# Patient Record
Sex: Male | Born: 1965 | Race: White | Hispanic: No | Smoking: Former smoker
Health system: Southern US, Community
[De-identification: ages and names within clinical notes are randomized; demographics above are authoritative.]

## PROBLEM LIST (undated history)

## (undated) DIAGNOSIS — Z89512 Acquired absence of left leg below knee: Secondary | ICD-10-CM

## (undated) DIAGNOSIS — E119 Type 2 diabetes mellitus without complications: Secondary | ICD-10-CM

## (undated) DIAGNOSIS — B019 Varicella without complication: Secondary | ICD-10-CM

## (undated) DIAGNOSIS — I10 Essential (primary) hypertension: Secondary | ICD-10-CM

## (undated) DIAGNOSIS — E785 Hyperlipidemia, unspecified: Secondary | ICD-10-CM

## (undated) HISTORY — DX: Type 2 diabetes mellitus without complications: E11.9

## (undated) HISTORY — DX: Essential (primary) hypertension: I10

## (undated) HISTORY — PX: APPENDECTOMY: SHX54

## (undated) HISTORY — DX: Hyperlipidemia, unspecified: E78.5

## (undated) HISTORY — DX: Acquired absence of left leg below knee: Z89.512

## (undated) HISTORY — DX: Varicella without complication: B01.9

## (undated) HISTORY — PX: AMPUTATION: SHX166

---

## 2001-11-01 ENCOUNTER — Encounter: Payer: Self-pay | Admitting: *Deleted

## 2001-11-01 ENCOUNTER — Ambulatory Visit (HOSPITAL_COMMUNITY): Admission: RE | Admit: 2001-11-01 | Discharge: 2001-11-01 | Payer: Self-pay | Admitting: *Deleted

## 2003-07-14 ENCOUNTER — Inpatient Hospital Stay (HOSPITAL_COMMUNITY): Admission: EM | Admit: 2003-07-14 | Discharge: 2003-07-17 | Payer: Self-pay

## 2003-08-20 ENCOUNTER — Encounter (HOSPITAL_BASED_OUTPATIENT_CLINIC_OR_DEPARTMENT_OTHER): Admission: RE | Admit: 2003-08-20 | Discharge: 2003-11-18 | Payer: Self-pay | Admitting: Internal Medicine

## 2003-11-19 ENCOUNTER — Encounter (HOSPITAL_BASED_OUTPATIENT_CLINIC_OR_DEPARTMENT_OTHER): Admission: RE | Admit: 2003-11-19 | Discharge: 2004-02-23 | Payer: Self-pay | Admitting: Internal Medicine

## 2004-02-24 ENCOUNTER — Encounter (HOSPITAL_BASED_OUTPATIENT_CLINIC_OR_DEPARTMENT_OTHER): Admission: RE | Admit: 2004-02-24 | Discharge: 2004-05-24 | Payer: Self-pay | Admitting: Internal Medicine

## 2004-03-11 ENCOUNTER — Encounter (INDEPENDENT_AMBULATORY_CARE_PROVIDER_SITE_OTHER): Payer: Self-pay | Admitting: *Deleted

## 2004-03-11 ENCOUNTER — Ambulatory Visit (HOSPITAL_BASED_OUTPATIENT_CLINIC_OR_DEPARTMENT_OTHER): Admission: RE | Admit: 2004-03-11 | Discharge: 2004-03-11 | Payer: Self-pay | Admitting: Orthopedic Surgery

## 2004-03-11 ENCOUNTER — Ambulatory Visit (HOSPITAL_COMMUNITY): Admission: RE | Admit: 2004-03-11 | Discharge: 2004-03-11 | Payer: Self-pay | Admitting: Orthopedic Surgery

## 2004-06-08 ENCOUNTER — Encounter (HOSPITAL_BASED_OUTPATIENT_CLINIC_OR_DEPARTMENT_OTHER): Admission: RE | Admit: 2004-06-08 | Discharge: 2004-06-16 | Payer: Self-pay | Admitting: Internal Medicine

## 2004-10-21 ENCOUNTER — Encounter (INDEPENDENT_AMBULATORY_CARE_PROVIDER_SITE_OTHER): Payer: Self-pay | Admitting: Specialist

## 2004-10-21 ENCOUNTER — Ambulatory Visit (HOSPITAL_COMMUNITY): Admission: RE | Admit: 2004-10-21 | Discharge: 2004-10-21 | Payer: Self-pay | Admitting: Orthopedic Surgery

## 2005-11-14 ENCOUNTER — Emergency Department (HOSPITAL_COMMUNITY): Admission: EM | Admit: 2005-11-14 | Discharge: 2005-11-15 | Payer: Self-pay | Admitting: Emergency Medicine

## 2006-11-06 ENCOUNTER — Inpatient Hospital Stay (HOSPITAL_COMMUNITY): Admission: EM | Admit: 2006-11-06 | Discharge: 2006-11-07 | Payer: Self-pay | Admitting: Emergency Medicine

## 2008-05-22 ENCOUNTER — Ambulatory Visit (HOSPITAL_COMMUNITY): Admission: RE | Admit: 2008-05-22 | Discharge: 2008-05-22 | Payer: Self-pay

## 2008-05-29 ENCOUNTER — Encounter (INDEPENDENT_AMBULATORY_CARE_PROVIDER_SITE_OTHER): Payer: Self-pay | Admitting: Orthopedic Surgery

## 2008-05-29 ENCOUNTER — Inpatient Hospital Stay (HOSPITAL_COMMUNITY): Admission: RE | Admit: 2008-05-29 | Discharge: 2008-06-01 | Payer: Self-pay | Admitting: Orthopedic Surgery

## 2008-09-18 ENCOUNTER — Encounter: Admission: RE | Admit: 2008-09-18 | Discharge: 2008-10-17 | Payer: Self-pay | Admitting: Orthopedic Surgery

## 2009-05-27 ENCOUNTER — Ambulatory Visit (HOSPITAL_COMMUNITY): Admission: RE | Admit: 2009-05-27 | Discharge: 2009-05-27 | Payer: Self-pay | Admitting: Orthopedic Surgery

## 2009-07-29 ENCOUNTER — Encounter (INDEPENDENT_AMBULATORY_CARE_PROVIDER_SITE_OTHER): Payer: Self-pay | Admitting: Orthopedic Surgery

## 2009-07-29 ENCOUNTER — Inpatient Hospital Stay (HOSPITAL_COMMUNITY): Admission: RE | Admit: 2009-07-29 | Discharge: 2009-08-03 | Payer: Self-pay | Admitting: Orthopedic Surgery

## 2010-06-07 LAB — GLUCOSE, CAPILLARY
Glucose-Capillary: 127 mg/dL — ABNORMAL HIGH (ref 70–99)
Glucose-Capillary: 146 mg/dL — ABNORMAL HIGH (ref 70–99)
Glucose-Capillary: 211 mg/dL — ABNORMAL HIGH (ref 70–99)

## 2010-06-08 LAB — COMPREHENSIVE METABOLIC PANEL
AST: 13 U/L (ref 0–37)
BUN: 9 mg/dL (ref 6–23)
CO2: 24 mEq/L (ref 19–32)
Chloride: 99 mEq/L (ref 96–112)
Creatinine, Ser: 0.94 mg/dL (ref 0.4–1.5)
GFR calc Af Amer: >60 mL/min (ref >60)
GFR calc non Af Amer: >60 mL/min (ref >60)
Glucose, Bld: 252 mg/dL — ABNORMAL HIGH (ref 70–99)
Total Bilirubin: 1.1 mg/dL (ref 0.3–1.2)

## 2010-06-08 LAB — CBC
HCT: 35.6 % — ABNORMAL LOW (ref 39.0–52.0)
Hemoglobin: 12.4 g/dL — ABNORMAL LOW (ref 13.0–17.0)
MCV: 86.8 fL (ref 78.0–100.0)
RBC: 4.1 MIL/uL — ABNORMAL LOW (ref 4.22–5.81)
WBC: 12 10*3/uL — ABNORMAL HIGH (ref 4.0–10.5)

## 2010-06-08 LAB — GLUCOSE, CAPILLARY
Glucose-Capillary: 162 mg/dL — ABNORMAL HIGH (ref 70–99)
Glucose-Capillary: 173 mg/dL — ABNORMAL HIGH (ref 70–99)
Glucose-Capillary: 209 mg/dL — ABNORMAL HIGH (ref 70–99)
Glucose-Capillary: 233 mg/dL — ABNORMAL HIGH (ref 70–99)
Glucose-Capillary: 243 mg/dL — ABNORMAL HIGH (ref 70–99)
Glucose-Capillary: 253 mg/dL — ABNORMAL HIGH (ref 70–99)
Glucose-Capillary: 268 mg/dL — ABNORMAL HIGH (ref 70–99)
Glucose-Capillary: 283 mg/dL — ABNORMAL HIGH (ref 70–99)

## 2010-06-08 LAB — CULTURE, ROUTINE-ABSCESS

## 2010-06-08 LAB — APTT: aPTT: 36 seconds (ref 24–37)

## 2010-06-08 LAB — PROTIME-INR
INR: 1.21 (ref 0.00–1.49)
Prothrombin Time: 14.7 seconds (ref 11.6–15.2)
Prothrombin Time: 15.2 seconds (ref 11.6–15.2)
Prothrombin Time: 23.2 seconds — ABNORMAL HIGH (ref 11.6–15.2)

## 2010-06-13 LAB — COMPREHENSIVE METABOLIC PANEL
Albumin: 3.8 g/dL (ref 3.5–5.2)
BUN: 11 mg/dL (ref 6–23)
Creatinine, Ser: 0.76 mg/dL (ref 0.4–1.5)
GFR calc Af Amer: >60 mL/min (ref >60)
Potassium: 4.1 mEq/L (ref 3.5–5.1)
Total Protein: 6.8 g/dL (ref 6.0–8.3)

## 2010-06-13 LAB — CBC
HCT: 40.4 % (ref 39.0–52.0)
MCV: 88.6 fL (ref 78.0–100.0)
Platelets: 207 10*3/uL (ref 150–400)
RDW: 12.8 % (ref 11.5–15.5)

## 2010-06-13 LAB — APTT: aPTT: 30 seconds (ref 24–37)

## 2010-06-13 LAB — GLUCOSE, CAPILLARY
Glucose-Capillary: 180 mg/dL — ABNORMAL HIGH (ref 70–99)
Glucose-Capillary: 196 mg/dL — ABNORMAL HIGH (ref 70–99)

## 2010-06-13 LAB — PROTIME-INR: INR: 0.9 (ref 0.00–1.49)

## 2010-07-01 LAB — GLUCOSE, CAPILLARY
Glucose-Capillary: 111 mg/dL — ABNORMAL HIGH (ref 70–99)
Glucose-Capillary: 236 mg/dL — ABNORMAL HIGH (ref 70–99)
Glucose-Capillary: 236 mg/dL — ABNORMAL HIGH (ref 70–99)
Glucose-Capillary: 254 mg/dL — ABNORMAL HIGH (ref 70–99)
Glucose-Capillary: 270 mg/dL — ABNORMAL HIGH (ref 70–99)
Glucose-Capillary: 275 mg/dL — ABNORMAL HIGH (ref 70–99)

## 2010-07-01 LAB — HEMOGLOBIN A1C: Mean Plasma Glucose: 169 mg/dL

## 2010-07-01 LAB — BASIC METABOLIC PANEL
BUN: 10 mg/dL (ref 6–23)
BUN: 7 mg/dL (ref 6–23)
BUN: 8 mg/dL (ref 6–23)
Calcium: 9.1 mg/dL (ref 8.4–10.5)
Calcium: 9.4 mg/dL (ref 8.4–10.5)
Calcium: 9.5 mg/dL (ref 8.4–10.5)
Chloride: 97 mEq/L (ref 96–112)
Creatinine, Ser: 0.72 mg/dL (ref 0.4–1.5)
Creatinine, Ser: 0.9 mg/dL (ref 0.4–1.5)
GFR calc non Af Amer: >60 mL/min (ref >60)
GFR calc non Af Amer: >60 mL/min (ref >60)
Glucose, Bld: 175 mg/dL — ABNORMAL HIGH (ref 70–99)
Glucose, Bld: 202 mg/dL — ABNORMAL HIGH (ref 70–99)
Potassium: 4.2 mEq/L (ref 3.5–5.1)
Sodium: 135 mEq/L (ref 135–145)
Sodium: 136 mEq/L (ref 135–145)

## 2010-07-01 LAB — CBC
HCT: 39.2 % (ref 39.0–52.0)
Hemoglobin: 13.7 g/dL (ref 13.0–17.0)
MCV: 90 fL (ref 78.0–100.0)
RBC: 4.35 MIL/uL (ref 4.22–5.81)
WBC: 9 10*3/uL (ref 4.0–10.5)

## 2010-07-01 LAB — APTT: aPTT: 32 seconds (ref 24–37)

## 2010-07-01 LAB — PROTIME-INR: Prothrombin Time: 15.1 seconds (ref 11.6–15.2)

## 2010-07-01 LAB — COMPREHENSIVE METABOLIC PANEL
AST: 43 U/L — ABNORMAL HIGH (ref 0–37)
BUN: 7 mg/dL (ref 6–23)
CO2: 25 mEq/L (ref 19–32)
Chloride: 102 mEq/L (ref 96–112)
Creatinine, Ser: 0.82 mg/dL (ref 0.4–1.5)
GFR calc non Af Amer: >60 mL/min (ref >60)
Total Bilirubin: 0.4 mg/dL (ref 0.3–1.2)

## 2010-08-03 NOTE — H&P (Signed)
Luis Lane, Luis Lane NO.:  [id number]   MEDICAL RECORD NO.:  [id number]          PATIENT TYPE:  OBV   LOCATION:  1529                         FACILITY:  [facility]   PHYSICIAN:  Hettie Holstein, D.O.    DATE OF BIRTH:  04-Apr-1965   DATE OF ADMISSION:  11/05/2006  DATE OF DISCHARGE:                              HISTORY & PHYSICAL   PRIMARY CARE PHYSICIAN:  Unassigned.   ORTHOPEDIST:  Nadara Mustard, M.D.   CHIEF COMPLAINT:  Redness and pain around chronic ulcer.   HISTORY OF PRESENT ILLNESS:  Luis Lane is a 45 year old male with a  history of diabetes, non-insulin-requiring who has a prior history of  amputations due to osteomyelitis.  First in 2005, he had a 1st ray  amputation in December 2005.  As well as a left 1st ray amputation by  Dr. Lajoyce Corners in August 2006.  He had recently been managing these chronic  ulcers on his left foot at home with Silvadene and a Gelfoam.  He has  been transitioning his care to [facility] physicians as he is moving  there.  He had an MRI about 4 months ago, however, he never followed up  to obtain these results.  He does have special orthotics for both of his  shoes.  He had been at [employer] this past few days and returned  home, had some chills and subjective fevers, one of the mornings of the  past few days.  He presents to the emergency department.  He was  evaluated by Dr. Preston Fleeting  and he is noted to have erythema surrounding his  ulcer.   PAST MEDICAL HISTORY:  1. Diabetes with neuropathy.  2. Hypercholesterolemia.  3. 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  4. History of tobacco abuse, continued.  5. Left arm fracture in the past.   MEDICATIONS:  1. Pravastatin 20 mg daily.  2. Benazepril 5 mg daily.  3. Glyburide.  4. Metformin., he is uncertain of the dose.   ALLERGIES:  OPIATES.  HE DEVELOPS SWELLING.   SOCIAL HISTORY:  He is a Luis Lane.  He smokes  about a pack of  cigarettes per day.  He has failed cessation with Chantix.  He is moving  to [place] and trying to establish care there.   FAMILY HISTORY:  Both of his parents are living in the mid 19s.  His  mother had uterine cancer and both suffered with diabetes.   REVIEW OF SYSTEMS:  His weight has been stable.  Appetite is stable.  No  nausea, vomiting.  He has had some subjective fevers and chills.  No  swelling of his lower extremities.  No dysuria or GI complaints.  Full  review of systems is negative.   PHYSICAL EXAMINATION:  VITAL SIGNS:  Temperature is 97.6, heart rate 91,  blood pressure 115/74, respirations 18, O2 saturation 96% on room air.  HEENT:  Reveals head to be normocephalic atraumatic.  Extraocular  muscles are intact.  NECK:  Supple, nontender.  No palpable thyromegaly or mass.  CARDIOVASCULAR:  Reveals normal S1 S2.  LUNGS:  Clear to auscultation.  ABDOMEN:  Soft, nontender.  No rebound or guarding.  LOWER EXTREMITIES:  Examination of his foot on the right was intact.  There was some small area of early pressure sore on his medial aspect of  his right foot.  On his left, he does have some excoriations on the  dorsal aspect of his toes which he states is secondary to his daughter  stepping on his foot on his plantar aspect of his foot.  On the plantar  aspect of his foot there is about a 2-to-3-cm wound with serosanguineous  central drainage.  There is surrounding erythema with yellowish-tan  circumferential eschar.  He does have another ulcer that appears to be  clean and dry on the more distal aspect of his post 1st ray amputation  site.   ASSESSMENT:  1. Diabetic foot ulcer with a possible osteomyelitis.  He does have      appearance of cellulitis.  2. Diabetes.  3. Tobacco abuse.  4. Hypertension.   PLAN:  1. At this time we will admit Luis Lane to the hospital.  2. We will obtain an MRI to ensure he is not developing osteomyelitis.  3. He may  need to see Dr. Lajoyce Corners depending on what this study shows.  4. We will place him on IV antibiotics and follow his clinical course.      Hettie Holstein, D.O.  Electronically Signed     ESS/MEDQ  D:  11/05/2006  T:  11/05/2006  Job:  [id number]   cc:   Nadara Mustard, MD  Fax: [phone]

## 2010-08-03 NOTE — Op Note (Signed)
[name], [name]                 ACCOUNT NO.:  [id number]   MEDICAL RECORD NO.:  [id number]          PATIENT TYPE:  INP   LOCATION:  2550                         FACILITY:  [facility]   PHYSICIAN:  Nadara Mustard, MD     DATE OF BIRTH:  Apr 21, 1965   DATE OF PROCEDURE:  05/29/2008  DATE OF DISCHARGE:                               OPERATIVE REPORT   PREOPERATIVE DIAGNOSES:  Osteomyelitis and abscess, left foot.   POSTOPERATIVE DIAGNOSES:  Osteomyelitis and abscess, left foot.   PROCEDURE:  Left transtibial amputation.   SURGEON:  Nadara Mustard, MD   ANESTHESIA:  General.   ESTIMATED BLOOD LOSS:  Minimal.   ANTIBIOTICS:  1 g of Kefzol.   DRAINS:  None.   COMPLICATIONS:  None.   TOURNIQUET TIME:  7 minutes at 300 mmHg to the thigh.  Leg sent to  pathology.   DISPOSITION:  PACU in stable condition.   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  The patient has had foot salvage surgery in the past, however, presents  at this time with osteomyelitis and a purulent abscess of the hindfoot.  Due to lack of viable foot salvage options, the patient presents at this  time for a transtibial amputation.  Risks and benefits were discussed  including persistent infection, neurovascular injury, nonhealing of the  wound, need for additional surgery.  The patient states he understands  and wishes to proceed at this time.   DESCRIPTION OF PROCEDURE:  The patient was brought to OR room 1 and  underwent general anesthetic.  After adequate level of anesthesia  obtained, the patient's left lower extremity was prepped using DuraPrep,  draped in a sterile field, and the foot was draped into an impervious  stockinette and was draped out of the surgical field.  A transverse  incision was made 10 cm distal to the tibial tubercle.  This curved  proximally and a large posterior flap was created.  The tibia  was  beveled anteriorly and transected 1 cm proximal to the skin incision.  The fibula was transected 1 cm proximal to the tibial incision.  An  amputation knife was used to create a large posterior flap.  The sciatic  nerve was pulled, cut, and allowed to retract.  The vascular bundles  were suture ligated 3 each with 2-0 silk.  The tourniquet was deflated after 7 minutes.  Hemostasis was obtained.  The deep and superficial fascial layers were closed using #1 PDS.  Skin  was closed using approximate staples.  The wound was covered with  Adaptic orthopedic sponges, ABD dressing, Kerlix, and Coban.  The  patient was then extubated, taken to PACU in stable condition.      Nadara Mustard, MD  Electronically Signed     MVD/MEDQ  D:  05/29/2008  T:  05/29/2008  Job:  [id number]

## 2010-08-06 NOTE — Op Note (Signed)
Luis Lane, Luis Lane NO.:  [id number]   MEDICAL RECORD NO.:  [id number]           PATIENT TYPE:   LOCATION:  2550                         FACILITY:  [facility]   PHYSICIAN:  Nadara Mustard, MD     DATE OF BIRTH:  06/06/1965   DATE OF PROCEDURE:  10/21/2004  DATE OF DISCHARGE:                                 OPERATIVE REPORT   PREOP DIAGNOSIS:  Osteomyelitis, left first metatarsal with Wagner grade 3  ulceration.   POSTOP DIAGNOSIS:  Osteomyelitis, left first metatarsal with Wagner grade 3  ulceration.   PROCEDURE:  Left first ray amputation.   SURGEON:  Nadara Mustard, MD   ANESTHESIA:  Ankle block.   ESTIMATED BLOOD LOSS:  Minimal.   ANTIBIOTICS:  Clindamycin 600 milligrams IV.   TOURNIQUET TIME:  None.   DRAINS:  None.   COMPLICATIONS:  None.   DISPOSITION:  To PACU in stable condition.   PLAN:  For discharge to home. Prescription for Darvocet for pain. The  patient has Cleocin at home that he will take 300 milligrams p.o. t.i.d. He  was given instructions for ice, elevation, nonweightbearing on the left.   INDICATIONS FOR PROCEDURE:  Patient is a 45 year old gentleman who is status  post sesamoid excision on the left for osteomyelitis of the sesamoids. The  patient has had a progressive ulceration over the first metatarsal head on  the left. After failure of conservative care with pressure unloading, wound  management and p.o. antibiotics, the patient presents at this time for first  ray amputation due to osteomyelitis. The risks and benefits were discussed  including infection, neurovascular injury, persistent pain, nonhealing  wound, need for higher-level amputation. The patient states he understands  and wished to proceed at this time.   DESCRIPTION OF PROCEDURE:  The patient was brought to OR room 17 after  undergoing an ankle block. After adequate level of anesthesia obtained, the  patient's left lower extremity was prepped using DuraPrep and  draped into a  sterile field. A racket incision was made to include the great toe and  ulcer. The first metatarsal was resected at its base. Hemostasis was  obtained with electrocautery. The wound was irrigated with normal saline.  The wound was  closed using a far-near-near-far suture with 2-0 nylon. The wound was  covered Adaptic orthopedic sponges, sterile Webril and a Coban dressing. The  patient was then taken to PACU in stable condition. Plan for discharge to  home. Follow-up in the office in two weeks.       MVD/MEDQ  D:  10/21/2004  T:  10/21/2004  Job:  [id number]

## 2010-08-06 NOTE — Consult Note (Signed)
NAME:  Luis Lane, Luis Lane                           ACCOUNT NO.:  [id number]   MEDICAL RECORD NO.:  [id number]                   PATIENT TYPE:  REC   LOCATION:  FOOT                                 FACILITY:  [facility]   PHYSICIAN:  Jonelle Sports. Sevier, M.D.              DATE OF BIRTH:  December 06, 1965   DATE OF CONSULTATION:  08/25/2003  DATE OF DISCHARGE:                                   CONSULTATION   HISTORY:  This 45 year old white male was seen at the courtesy of Dr. Ezzard Standing  for assistance of bilateral first metatarsal head plantar ulcers.   The patient apparently was a patient here several years ago, at which time  he had trauma-induced lesions on the base of his feet due to walking in the  hot sand barefooted.  We apparently got those healed without needing to use  casting or other aggressive techniques.   In March of this year, he had a similar experience whereby he walked  barefooted on the rough terrain and induced formation of ulcers in  previously callused areas underlying both metatarsal heads.  These got  secondarily infected with cellulitis and he was hospitalized and treated  with IV antibiotics and then continuation of oral antibiotics.  This was in  mid March.  Since that time, he has continued to treat the ulcers in both of  the first metatarsal head areas with wound gel and bandages.  He has had  some progression healing on the left, but has not made much progress on the  right.  He is accordingly referred here now for our further evaluation and  treatment.   PAST MEDICAL HISTORY:  He has hypercholesterolemia, as well as diabetes.   MEDICATIONS:  He takes Actos, Lipitor, Lopid, and Avandia.   PHYSICAL EXAMINATION:  Examination today is limited to the distal lower  extremities.  The patient's feet are without gross deformity and free of  edema.  Skin temperatures are in the low to mid normal range and are  substantially higher on the right foot.  The large ulcer is located  then on  the contralateral side.  Pulses are everywhere palpable and adequate.  Monofilament testing shows that he lacks protective sensation throughout  both feet.   On the plantar aspect of the left foot, there is general callus formation  underlying the fourth and fifth metatarsal heads and also and ulcerated  callus underlying the first metatarsal head.  The ulcer there is  approximately 4 x 3 x 1 mm in dimension.   On the plantar aspect of the right foot, there is minimal callus formation  on the plantar aspect of the hallux and at the fifth metatarsal head area,  but at the first metatarsal head area is a very extensive area of callus  with central ulceration measuring 20 x 25 x 2 mm.  There is some drainage,  but no malodor.  The  wound did not probe to bone.   DISPOSITION:  1. The patient is given instruction regarding foot care and diabetes again     by video with nurse and physician reinforcement.  It is specifically     pointed out to him that in the absence of protective sensation he should     never good barefoot under any circumstances.  2. The ulcer underlying the first metatarsal head on the left is debrided by     removal of a much adjacent callus in the margins of the wound itself,     which are callused and overhang slightly.  There is no evidence of layer     of deep infection.  This wound will be treated with Neosporin, an Allevyn     pad, and a protective donut.  3. The ulcer underlying the first metatarsal head on the left is addressed     again with aggressive debridement of wound margins, some slough within     the wound, and considerable adjacent callus.  The recommendation will be     that this limb be placed in a total contact cast to assure healing.  The     patient will return for this on the day following this consultation and     in the interim will have that wound dressed with Neosporin, Bactroban,     and again a donut.  4. The patient's return is set  for tomorrow for a cast application and then     he will be seen at weekly intervals thereafter.                                               Jonelle Sports. Cheryll Cockayne, M.D.    RES/MEDQ  D:  08/25/2003  T:  08/26/2003  Job:  [id number]   cc:   Vikki Ports, M.D.  [address] [place]  [place]  [place] [address]  Fax: [phone]   Sandria Bales. Ezzard Standing, M.D.  [address]  [place]  [place] [address]  Fax: [phone]

## 2010-08-06 NOTE — Op Note (Signed)
[name], [name]                 ACCOUNT NO.:  [id number]   MEDICAL RECORD NO.:  [id number]          PATIENT TYPE:  AMB   LOCATION:  DSC                          FACILITY:  [facility]   PHYSICIAN:  Nadara Mustard, MD     DATE OF BIRTH:  June 21, 1965   DATE OF PROCEDURE:  03/11/2004  DATE OF DISCHARGE:                                 OPERATIVE REPORT   PREOPERATIVE DIAGNOSIS:  1.  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  2.  Osteomyelitis of the right great toe sesamoids.   POSTOPERATIVE DIAGNOSIS:  1.  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  2.  Osteomyelitis of the right first metatarsal and right great toe      sesamoids.   PROCEDURE:  1.  Bilateral gastrocnemius recessions.  2.  Right great toe sesamoid excision as well as a first ray amputation.   SURGEON:  Nadara Mustard, M.D.   ANESTHESIA:  General.   ESTIMATED BLOOD LOSS:  Minimal.   ANTIBIOTICS:  1 gram Kefzol.   DISPOSITION:  To the PACU in stable condition.   INDICATIONS FOR PROCEDURE:  The patient is a 45 year old gentleman with type  2 diabetes with a chronic history of heel cord contracture and insensate  neuropathic ulcers beneath the first metatarsal heads bilaterally.  The  patient has undergone a prolonged conservative care which has included total  contact casting for the ulcers beneath the great toe metatarsal heads.  The  patient has had relief after total contact casting, but has had recurrent  breakdown.  He has failed conservative care with stretching of the heel cord  and presents at this time with infection, osteomyelitis, abscess of the  right great toe, and heel cord contractures, and presents for surgical  intervention.  The risks and benefits of surgery were discussed including  infection, neurovascular injury, rupture of the Achilles, need for  additional surgery, potential for below the knee amputation.  The patient  states he  understands and wishes to proceed at this time.   DESCRIPTION OF PROCEDURE:  The patient was brought to OR room 5 and  underwent a general anesthetic.  After an adequate level of anesthesia was  obtained, the patient was placed in the prone position and both lower  extremities were prepped using DuraPrep and draped into a sterile field.  Attention was first focused on the gastrocnemius recessions bilaterally.  The attention was first focused on the left.  A small 2 cm incision was made  2 cm distal the musculotendinous junction of the gastrocnemius muscle.  This  was made over the sural and small saphenous vein.  The sural nerve and small  saphenous vein were retracted and elevated.  The fascia over the gastroc  muscle was incised and this was released both medially and laterally.  The  patient had an approximately 2 cm improvement in the distraction across the  gastrocnemius muscle.  The patient had preoperatively had an equinus  contracture of 10 degrees and postoperative had dorsiflexion of 30 degrees  with his knee extended.  The wound  was cleansed and the skin was closed  using far-near near-far stitch with 2-0 nylon.  Attention was then focused  on the right gastrocnemius muscle.  Again, a 2 cm incision was made 2 cm  distal to the musculotendinous junction of the gastrocnemius muscles.  Blunt  dissection was carried down.  The sural and small saphenous vein were  protected and elevated with an [employer] retractor.  The fascia was incised  and the gastrocnemius fascia was released, both medially and laterally.  Again, there was approximately 2 cm of relief with the recession.  The foot  had a 10 degree equinus contraction preoperatively and had 30 degrees of  dorsiflexion postoperatively.  The wound was cleansed and the skin was  closed using far-near near-far stitch with 2-0 nylon suture.  Attention was  then focused on the plantar ulcer on the right foot.  The ulcer was ellipsed   out along with the necrotic sesamoid bones.  The patient had infection which  involved the metatarsal head which had completely collapsed.  The metatarsal  head was resected and approximately 50% of the metatarsal was also resected.  The skin and soft tissue was debrided.  There was no purulent abscess, no  necrotic tissue left in the wound.  There was good bleeding wound edges.  The wound was irrigated with normal saline.  The wound was loosely closed  using a far-near near-far stitch with 2-0 nylon.  The wound approximated  without any tension on the skin and the wound was allowed to drain without  tight closure.  The wounds were covered with Adaptic, orthopedic sponges,  sterile Webril, and a Coban dressing was wrapped from toes to proximal calf.  The patient was placed supine, extubated, and taken to the PACU in stable  condition.  He will continue with his p.o. antibiotics, will continue with  non-weight bearing on the right, and follow up in the office in one week.      Luis Lane   MVD/MEDQ  D:  03/11/2004  T:  03/12/2004  Job:  [id number]

## 2010-08-06 NOTE — Discharge Summary (Signed)
NAME:  Luis Lane, Luis Lane                           ACCOUNT NO.:  [id number]   MEDICAL RECORD NO.:  [id number]                   PATIENT TYPE:  INP   LOCATION:  0347                                 FACILITY:  [facility]   PHYSICIAN:  Corinna L. Lendell Caprice, MD             DATE OF BIRTH:  December 01, 1965   DATE OF ADMISSION:  07/14/2003  DATE OF DISCHARGE:                                 DISCHARGE SUMMARY   DISCHARGE DIAGNOSES:  1. Bilateral diabetic foot ulcers with cellulitis.  2. Uncontrolled type 2 diabetes.  3. Hyperlipidemia.  4. Tobacco abuse.   DISCHARGE MEDICATIONS:  1. Actos has been increased to 45 mg daily.  2. Amaryl has been started at 2 mg daily.  3. Augmentin 875 mg p.o. b.i.d. for 10 more days.  4. He is to continue his other home medications which include Lipitor and     Lopid.   CONDITION AT DISCHARGE:  Stable.   ACTIVITY:  He is to stay off his feet and be on desk duty as much as  possible.  He is to follow up with Dr. Theresia Lo in a week.   CONSULTATIONS:  Dr. Cleophas Dunker and cardiovascular thoracic surgery.   DIET:  Diabetic.   He is encouraged to quit smoking.   PERTINENT LABORATORY DATA:  Blood culture is negative.  Complete metabolic  panel was significant for a glucose of 221.  CBC was unremarkable.   SPECIAL STUDIES AND RADIOLOGY:  X-rays showed no fracture, foreign body, or  osteomyelitis.  MRI showed cellulitis but no osteomyelitis.   HISTORY AND HOSPITAL COURSE:  Luis Lane is a 46 year old white male with a  history of smoking, hyperlipidemia, and diabetes who presented with two foot  ulcers, one on each plantar surface of the metatarsophalangeal joint.  He  had been having some fevers.  The ulcers were noted about 4 weeks prior to  admission but he was not concerned until they started getting more red and  swollen.  On physical examination he was afebrile and had normal vital  signs.  He had about a 1.5 cm x 1.5 cm ulcer on both metatarsophalangeal  joints with  some serosanguineous drainage and necrosis on the right side.  There was a lot of surrounding erythema, warmth, and edema.  The patient was  admitted to the floor, given IV Unasyn.  Osteomyelitis was ruled out.  He  was seen by orthopedics and vascular surgery who did not recommend any  further workup or debridement but to continue the Hydrogel that the wound  care nurse had recommended.  His ulcers had not changed tremendously during  his hospitalization but the redness and swelling was much improved.  Corinna L. Lendell Caprice, MD    CLS/MEDQ  D:  07/17/2003  T:  07/17/2003  Job:  [id number]   cc:   Vikki Ports, M.D.  [address]  [place]  [place] [address]  Fax: [phone]   Elana Alm. Thurston Hole, M.D.  [address]  [place] [address]  Fax: [phone]   Claude Manges. Cleophas Dunker, M.D.  [address] [place]  [place] [address]  Fax: [phone]

## 2010-08-06 NOTE — Discharge Summary (Signed)
Luis Lane, Luis Lane NO.:  [id number]   MEDICAL RECORD NO.:  [id number]          PATIENT TYPE:  INP   LOCATION:  5013                         FACILITY:  [facility]   PHYSICIAN:  Nadara Mustard, MD     DATE OF BIRTH:  Sep 12, 1965   DATE OF ADMISSION:  05/29/2008  DATE OF DISCHARGE:  06/01/2008                               DISCHARGE SUMMARY   FINAL DIAGNOSIS:  Osteomyelitis and abscess, left foot.   PROCEDURE:  Left transtibial amputation.   DISPOSITION:  Discharged to home in stable condition with advanced home  health physical therapy.  Prescription for Tylox and Neurontin supplies  for diabetes including Lantus insulin.   FOLLOWUP:  Follow up in the office in 2 weeks.   HISTORY OF PRESENT ILLNESS:  The patient is a 45 year old gentleman with  diabetic insensate neuropathy with foot salvage surgery in the past who  presents with chronic ulcer with the development of osteomyelitis,  abscess, and failure of conservative wound care and presents for  transtibial amputation.  The patient's hospital course is essentially  unremarkable.  He underwent a left transtibial amputation on May 29, 2008.  He received Kefzol for infection prophylaxis.  Postoperatively,  the patient progressed slowly with physical therapy.  He had a  hemoglobin A1c of 7.5 and he was started on insulin for the first time.  The patient was set up with his primary care physician to start with  insulin control for his diabetes.  The patient was able to independently  ambulate and was discharged to home in stable condition on June 01, 2008, with followup in the office in 2 weeks.      Nadara Mustard, MD  Electronically Signed     MVD/MEDQ  D:  07/30/2008  T:  07/30/2008  Job:  [id number]

## 2010-12-21 IMAGING — CR DG CHEST 2V
2 series · 2 of 2 positions shown · non-contrast
Comparison: None.

CLINICAL DATA: Preoperative cardiopulmonary evaluation.  History of
previous tobacco smoking.  History of diabetes.

CHEST - 2 VIEW

[view not recorded (1 of 2)]
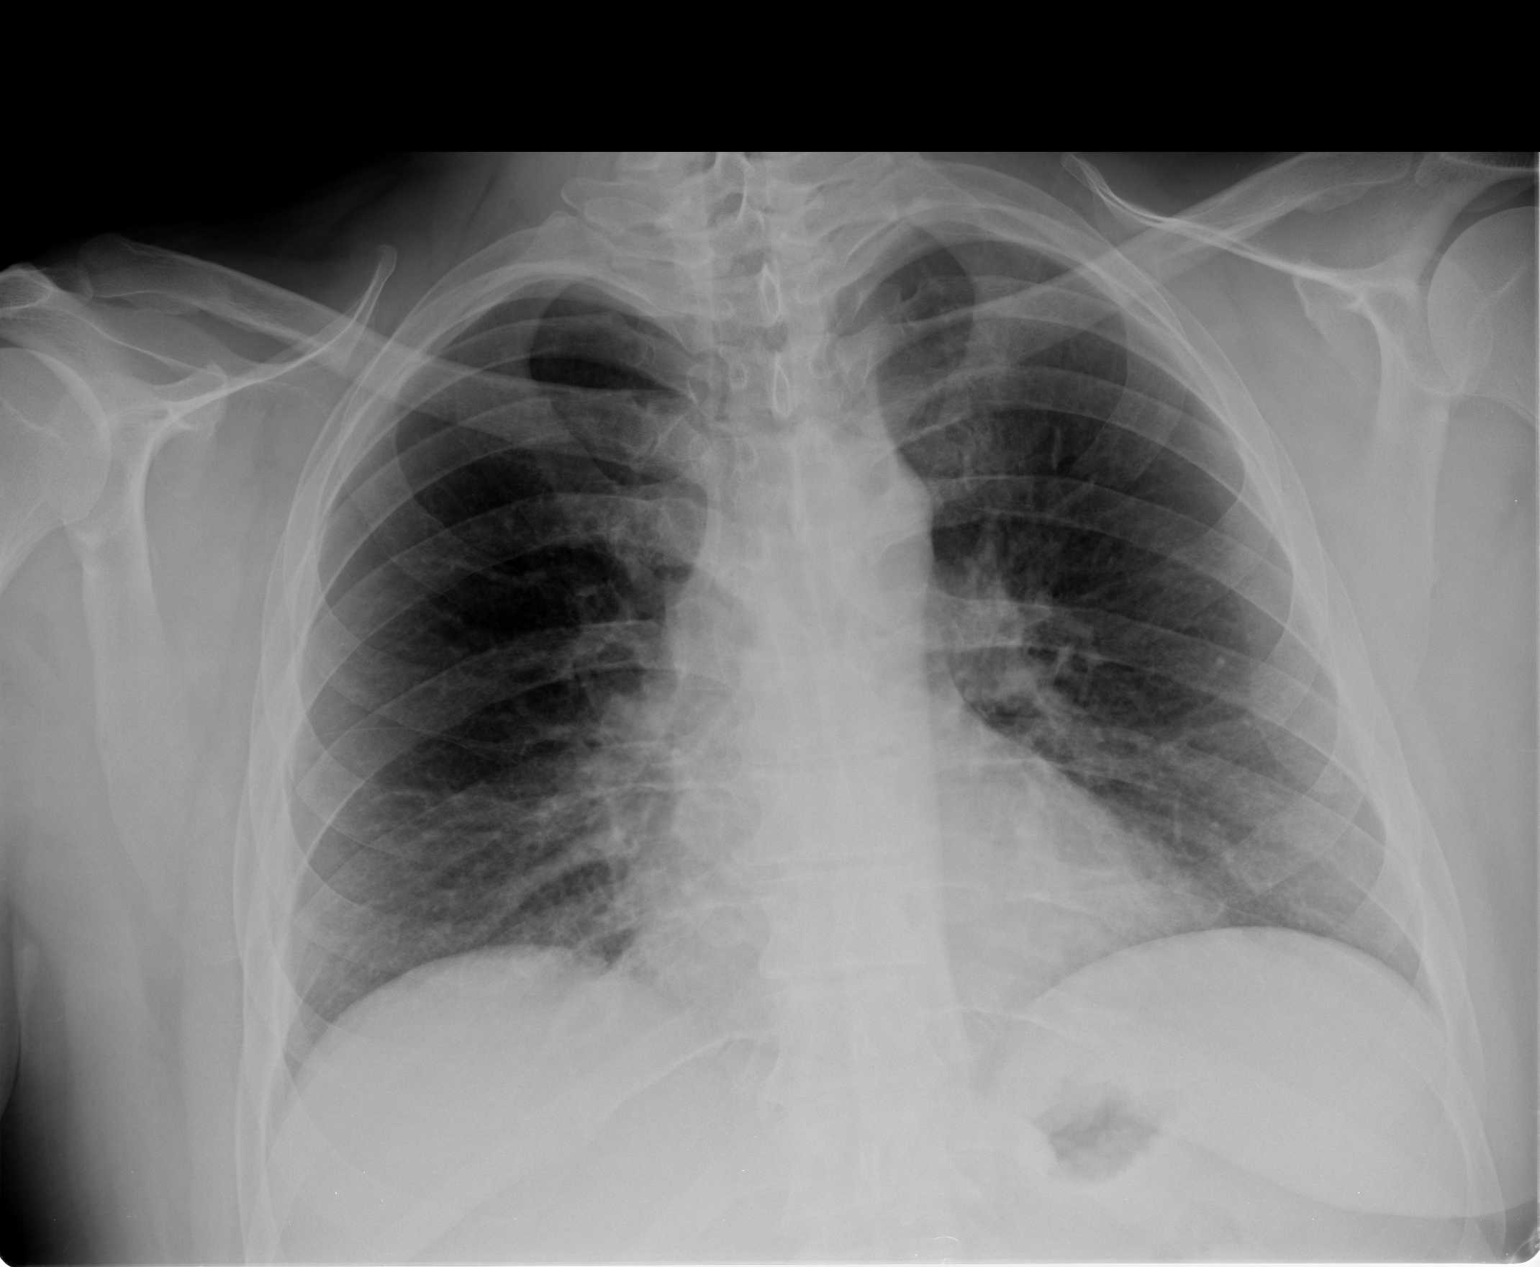

[view not recorded (2 of 2)]
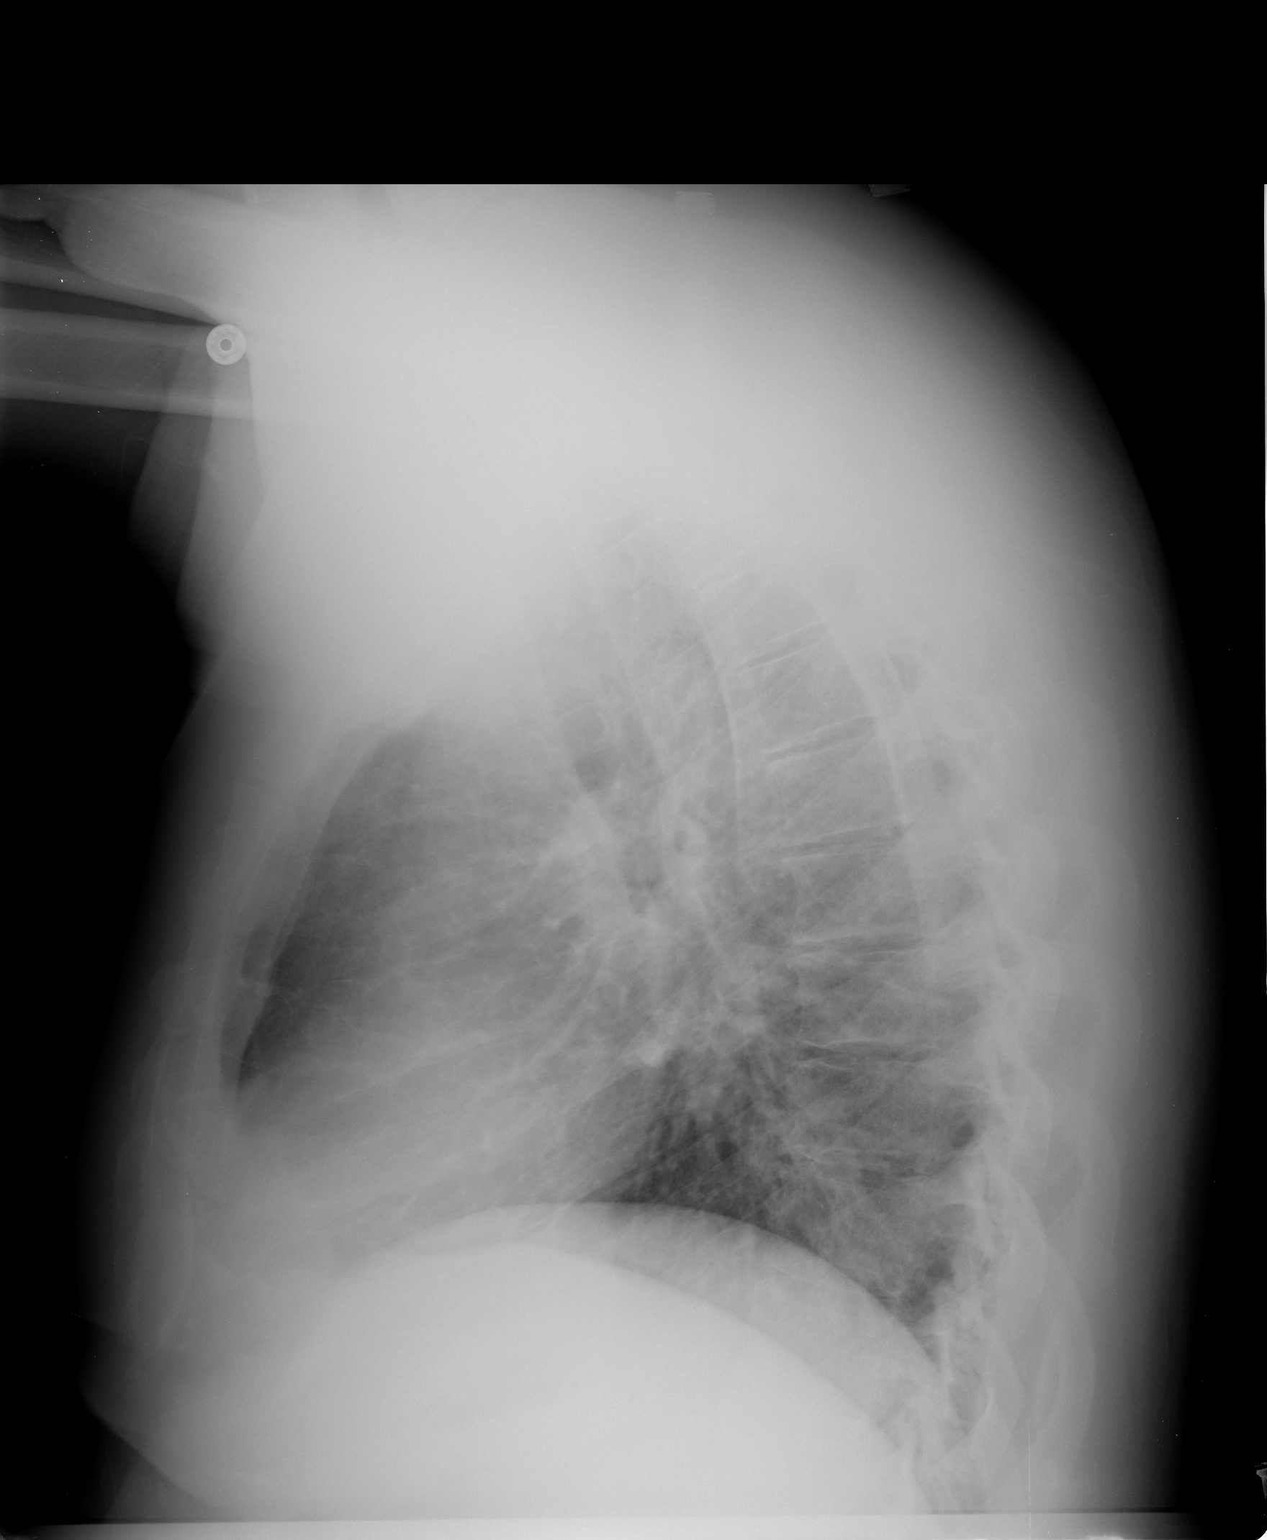

[2 of 2 positions shown; findings below may reference images not displayed]

FINDINGS: The cardiac silhouette is normal size and shape. The
lungs are well aerated and free of infiltrates. No pleural
abnormality is evident. There is mild degenerative spondylosis
compatible with age.
IMPRESSION: No acute or active process is identified.

## 2010-12-31 LAB — LIPID PANEL
Cholesterol: 159
HDL: 33 — ABNORMAL LOW
LDL Cholesterol: 72
Total CHOL/HDL Ratio: 4.8
Triglycerides: 268 — ABNORMAL HIGH
VLDL: 54 — ABNORMAL HIGH

## 2010-12-31 LAB — BASIC METABOLIC PANEL
CO2: 24
Glucose, Bld: 147 — ABNORMAL HIGH
Potassium: 4.1
Sodium: 137

## 2010-12-31 LAB — CBC
HCT: 40.2
Hemoglobin: 14.3
MCHC: 35
MCHC: 35.5
MCV: 88.1
Platelets: 170
RDW: 12.7
RDW: 12.9

## 2010-12-31 LAB — DIFFERENTIAL
Basophils Absolute: 0
Basophils Relative: 0
Eosinophils Relative: 2
Monocytes Absolute: 0.6

## 2011-03-22 HISTORY — PX: OTHER SURGICAL HISTORY: SHX169

## 2016-04-21 ENCOUNTER — Other Ambulatory Visit (HOSPITAL_COMMUNITY)
Admission: RE | Admit: 2016-04-21 | Discharge: 2016-04-21 | Disposition: A | Discharge status: Home / Self Care | Payer: Managed Care, Other (non HMO) | Source: Ambulatory Visit | Attending: Family Medicine | Admitting: Family Medicine

## 2016-04-21 ENCOUNTER — Encounter: Payer: Self-pay | Admitting: Family Medicine

## 2016-04-21 ENCOUNTER — Ambulatory Visit (INDEPENDENT_AMBULATORY_CARE_PROVIDER_SITE_OTHER): Payer: Managed Care, Other (non HMO) | Admitting: Family Medicine

## 2016-04-21 VITALS — BP 114/86 | HR 105 | Temp 98.4°F | Ht 76.25 in | Wt 260.2 lb

## 2016-04-21 DIAGNOSIS — Z23 Encounter for immunization: Secondary | ICD-10-CM

## 2016-04-21 DIAGNOSIS — E1142 Type 2 diabetes mellitus with diabetic polyneuropathy: Secondary | ICD-10-CM

## 2016-04-21 DIAGNOSIS — E119 Type 2 diabetes mellitus without complications: Secondary | ICD-10-CM

## 2016-04-21 DIAGNOSIS — Z794 Long term (current) use of insulin: Secondary | ICD-10-CM | POA: Diagnosis not present

## 2016-04-21 DIAGNOSIS — Z113 Encounter for screening for infections with a predominantly sexual mode of transmission: Secondary | ICD-10-CM | POA: Diagnosis not present

## 2016-04-21 DIAGNOSIS — Z87891 Personal history of nicotine dependence: Secondary | ICD-10-CM

## 2016-04-21 DIAGNOSIS — I10 Essential (primary) hypertension: Secondary | ICD-10-CM | POA: Diagnosis not present

## 2016-04-21 DIAGNOSIS — Z7251 High risk heterosexual behavior: Secondary | ICD-10-CM

## 2016-04-21 DIAGNOSIS — N5201 Erectile dysfunction due to arterial insufficiency: Secondary | ICD-10-CM

## 2016-04-21 DIAGNOSIS — E785 Hyperlipidemia, unspecified: Secondary | ICD-10-CM | POA: Diagnosis not present

## 2016-04-21 DIAGNOSIS — N529 Male erectile dysfunction, unspecified: Secondary | ICD-10-CM | POA: Insufficient documentation

## 2016-04-21 DIAGNOSIS — E1149 Type 2 diabetes mellitus with other diabetic neurological complication: Secondary | ICD-10-CM

## 2016-04-21 DIAGNOSIS — E1140 Type 2 diabetes mellitus with diabetic neuropathy, unspecified: Secondary | ICD-10-CM | POA: Insufficient documentation

## 2016-04-21 DIAGNOSIS — Z89512 Acquired absence of left leg below knee: Secondary | ICD-10-CM | POA: Diagnosis not present

## 2016-04-21 HISTORY — DX: Essential (primary) hypertension: I10

## 2016-04-21 HISTORY — DX: Type 2 diabetes mellitus without complications: E11.9

## 2016-04-21 HISTORY — DX: Acquired absence of left leg below knee: Z89.512

## 2016-04-21 HISTORY — DX: Hyperlipidemia, unspecified: E78.5

## 2016-04-21 LAB — COMPREHENSIVE METABOLIC PANEL
ALK PHOS: 57 U/L (ref 39–117)
ALT: 41 U/L (ref 0–53)
AST: 34 U/L (ref 0–37)
Albumin: 4.8 g/dL (ref 3.5–5.2)
BUN: 19 mg/dL (ref 6–23)
CHLORIDE: 104 meq/L (ref 96–112)
CO2: 26 meq/L (ref 19–32)
Calcium: 9.8 mg/dL (ref 8.4–10.5)
Creatinine, Ser: 0.85 mg/dL (ref 0.40–1.50)
GFR: 102.16 mL/min (ref >60.00)
GLUCOSE: 156 mg/dL — AB (ref 70–99)
POTASSIUM: 4.6 meq/L (ref 3.5–5.1)
Sodium: 138 mEq/L (ref 135–145)
Total Bilirubin: 0.6 mg/dL (ref 0.2–1.2)
Total Protein: 6.8 g/dL (ref 6.0–8.3)

## 2016-04-21 LAB — CBC
HEMATOCRIT: 45.5 % (ref 39.0–52.0)
HEMOGLOBIN: 15.3 g/dL (ref 13.0–17.0)
MCHC: 33.7 g/dL (ref 30.0–36.0)
MCV: 89 fl (ref 78.0–100.0)
Platelets: 165 10*3/uL (ref 150.0–400.0)
RBC: 5.11 Mil/uL (ref 4.22–5.81)
RDW: 14.1 % (ref 11.5–15.5)
WBC: 6.9 10*3/uL (ref 4.0–10.5)

## 2016-04-21 LAB — HEMOGLOBIN A1C: Hgb A1c MFr Bld: 6.2 % (ref 4.6–6.5)

## 2016-04-21 LAB — LDL CHOLESTEROL, DIRECT: Direct LDL: 62 mg/dL

## 2016-04-21 MED ORDER — SILDENAFIL CITRATE 100 MG PO TABS: 100.0000 mg | ORAL_TABLET | Freq: Every day | ORAL | 0 refills | Status: DC | PRN

## 2016-04-21 MED ORDER — SILDENAFIL CITRATE 100 MG PO TABS: 50.0000 mg | ORAL_TABLET | Freq: Every day | ORAL | 0 refills | Status: DC | PRN

## 2016-04-21 NOTE — Assessment & Plan Note (Signed)
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 No sensation to calf on right. Sees podiatry regularly and watches his feet. No pain medicine needed.

## 2016-04-21 NOTE — Assessment & Plan Note (Signed)
S: crestor 10mg , fish oil. Allergic to aspirin even 81mg - hives.  /P: LDL at goal <70 today

## 2016-04-21 NOTE — Assessment & Plan Note (Signed)
S: levitra is not working for him. A/P:Trial viagra #10 local. He will mil #90 off.

## 2016-04-21 NOTE — Progress Notes (Signed)
Pre visit review using our clinic review tool, if applicable. No additional management support is needed unless otherwise documented below in the visit note. 

## 2016-04-21 NOTE — Assessment & Plan Note (Signed)
S: controlled on ramipril 10mg  BID.  BP Readings from Last 3 Encounters:  04/21/16 114/86  A/P:Continue current meds:  Doing well

## 2016-04-21 NOTE — Assessment & Plan Note (Signed)
quit 2012 15 pack years. Age 51 AAA screen planned. Could do yearly UAs

## 2016-04-21 NOTE — Assessment & Plan Note (Signed)
Patient has a prosthetic leg. Wrote rx for 2 liners for [employer].   Follows with podiatry for remaining foot which has had 1 amputation. He watches very closely now

## 2016-04-21 NOTE — Progress Notes (Signed)
Phone: [phone]  Subjective:  Patient presents today to establish care.  Prior patient of [facility] years and years ago but then had moved to [place] (Dr. Eulas Post) then to [place] and when came back to [place]- saw Dr. Eulas Post in November. Chief complaint-noted.   See problem oriented charting  The following were reviewed and entered/updated in epic: Past Medical History:  Diagnosis Date  . Chicken pox   . Diabetes mellitus type II, controlled ([place]) 04/21/2016   Left BKA 2013. Last a1c in November was 6.2. [facility] 07/2014. victoza 1.8, metformin 1g BID, lantus 70 BID--> tresiba due to insurance. Humalog 20 units 3x a day. Exercise helps. Nutritionist desired.   Marland Kitchen Hx of BKA, left ([place]) 04/21/2016   2013 duda  . Hyperlipidemia 04/21/2016   crestor 10mg , fish oil. Allergic to aspirin even 81mg - hives.   . Hypertension 04/21/2016   Ramipril 10mg  BID   Patient Active Problem List   Diagnosis Date Noted  . Diabetes mellitus type II, controlled ([place]) 04/21/2016    Priority: High  . Hyperlipidemia 04/21/2016    Priority: Medium  . Hx of BKA, left ([place]) 04/21/2016    Priority: Medium  . Hypertension 04/21/2016    Priority: Medium  . Former smoker 04/21/2016    Priority: Low  . Erectile dysfunction 04/21/2016    Priority: Low   Past Surgical History:  Procedure Laterality Date  . AMPUTATION     3rd toe right  . APPENDECTOMY     1974  . Left Leg Amputee Left 2013    Family History  Problem Relation Age of Onset  . Diabetes Mother     pt at [facility]  . Ovarian cancer Mother   . Hypertension Mother   . Diabetes Father   . Diabetes Brother   . Diabetic kidney disease Brother      and retinopathy  . Alcohol abuse Maternal Grandmother     and some uncles. moms side  . Stroke Paternal Grandmother     smoker as well  . Heart attack Paternal Grandmother   . Diabetes Paternal Grandmother     Medications- reviewed and updated Current Outpatient Prescriptions    Medication Sig Dispense Refill  . Ascorbic Acid (VITAMIN C) 1000 MG tablet Take 1,000 mg by mouth daily.    . Insulin Degludec (TRESIBA FLEXTOUCH) 200 UNIT/ML SOPN Inject 70 Units into the skin 2 (two) times daily.    . insulin lispro (HUMALOG KWIKPEN) 100 UNIT/ML KiwkPen Inject 20 Units into the skin 3 (three) times daily.    Marland Kitchen liraglutide (VICTOZA) 18 MG/3ML SOPN Inject 18 mg into the skin daily.    . metFORMIN (GLUCOPHAGE) 1000 MG tablet Take 1,000 mg by mouth 2 (two) times daily with a meal.    . Omega-3 Fatty Acids (FISH OIL) 1200 MG CPDR Take 1 capsule by mouth daily.    . ramipril (ALTACE) 10 MG capsule Take 10 mg by mouth 2 (two) times daily.    . rosuvastatin (CRESTOR) 10 MG tablet Take 10 mg by mouth daily.    Marland Kitchen ZINC OXIDE PO Take 1 tablet by mouth daily.     No current facility-administered medications for this visit.     Allergies-reviewed and updated Allergies  Allergen Reactions  . Asa [Aspirin] Hives and Shortness Of Breath  . Morphine And Related Hives and Shortness Of Breath    Social History   Social History  . Marital status: Divorced    Spouse name: N/A  . Number of  children: N/A  . Years of education: N/A   Social History Main Topics  . Smoking status: Former Smoker    Packs/day: 1.00    Years: 15.00    Quit date: 03/21/2010  . Smokeless tobacco: Never Used  . Alcohol use No  . Drug use: No  . Sexual activity: Not Asked   Other Topics Concern  . None   Social History Narrative   Divorced. Lives with parents. 3 children. Oldest son 59- in [place] in school. Kids lives in [place].    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  HENT: Negative for hearing  loss and tinnitus.   Eyes: Negative for blurred vision and double vision.  Respiratory: Negative for cough and hemoptysis.   Cardiovascular: Negative for chest pain and palpitations.  Gastrointestinal: Negative for heartburn and nausea.  Genitourinary: Negative for dysuria and urgency.  Musculoskeletal: Negative for myalgias and neck pain.  Skin: Negative for itching and rash.  Neurological: Negative for dizziness and headaches.  Endo/Heme/Allergies: Negative for polydipsia. Does not bruise/bleed easily.  Psychiatric/Behavioral: Negative for hallucinations and substance abuse.   Objective: BP 114/86 (BP Location: Left Arm, Patient Position: Sitting, Cuff Size: Large)   Pulse (!) 105   Temp 98.4 F (36.9 C) (Oral)   Ht 6' 4.25" (1.937 m)   Wt 260 lb 3.2 oz (118 kg)   SpO2 97%   BMI 31.47 kg/m  Gen: NAD, resting comfortably, tall, obese HEENT: Mucous membranes are moist. Oropharynx normal. TM normal. Eyes: sclera and lids normal, PERRLA Neck: no thyromegaly, no cervical lymphadenopathy CV: RRR no murmurs rubs or gallops. HR high normal on my exam Lungs: CTAB no crackles, wheeze, rhonchi Abdomen: soft/nontender/nondistended/normal bowel sounds. No rebound or guarding.  Ext: no edema on right, left BKA Skin: warm, dry, no rash Neuro: 5/5 strength in upper and lower extremities, normal gait  Diabetic Foot Exam - Simple   Simple Foot Form Diabetic Foot exam was performed with the following findings:  Yes 04/21/2016 10:35 AM  Visual Inspection See comments:  Yes Sensation Testing See comments:  Yes Pulse Check See comments:  Yes Comments Right leg- PT 2+, 1+ DP. No edema. No sensation to monofilament until mid calf. Loss of 3rd digit.  Left- BKA     Assessment/Plan:  Diabetes mellitus type II, controlled ([place]) S: Left BKA 2013. Last a1c in November was 6.2. endocrine [facility] 07/2014. victoza 1.8, metformin 1g BID, lantus 70 BID--> tresiba due to insurance. Humalog 20 units 3x  a day. Exercise helps. Nutritionist desired. Lab Results  Component Value Date   HGBA1C 6.2 04/21/2016  A/P: A1c looks great today on current medicine- will continue current dose. Endocrine in past- wants to work with PCP as long as controlled  Hypertension S: controlled on ramipril 10mg  BID.  BP Readings from Last 3 Encounters:  04/21/16 114/86  A/P:Continue current meds:  Doing well   Former smoker quit 2012 15 pack years. Age 10 AAA screen planned. Could do yearly UAs  Erectile dysfunction S: levitra is not working for him. A/P:Trial viagra #10 local. He will mil #90 off.     Hx of BKA, left ([place])  Patient has a prosthetic leg. Wrote rx for 2 liners for [employer].   Follows with podiatry for remaining foot which has had 1 amputation. He watches very closely now  Hyperlipidemia S: crestor 10mg , fish oil. Allergic to aspirin even 81mg - hives.  /P: LDL at goal <70 today   Wants STD testing as has had unprotected sex  Return in about 14 weeks (around 07/28/2016).   Orders Placed This Encounter  Procedures  . Pneumococcal polysaccharide vaccine 23-valent greater than or equal to 2yo subcutaneous/IM  . LDL cholesterol, direct    [facility]  . CBC    [facility]  . Comprehensive metabolic panel    [facility]  . Hemoglobin A1c    [place]  . HIV antibody    [facility]  . RPR    [facility]  . Ambulatory referral to Ophthalmology    Referral Priority:   Routine    Referral Type:   Consultation    Referral Reason:   Specialty Services Required    Requested Specialty:   Ophthalmology    Number of Visits Requested:   1    Meds ordered this encounter  Medications  . liraglutide (VICTOZA) 18 MG/3ML SOPN    Sig: Inject 18 mg into the skin daily.  . rosuvastatin (CRESTOR) 10 MG tablet    Sig: Take 10 mg by mouth daily.  . ramipril (ALTACE) 10 MG capsule    Sig: Take 10 mg by mouth 2 (two) times daily.  . insulin lispro (HUMALOG KWIKPEN) 100 UNIT/ML KiwkPen    Sig: Inject 20 Units into  the skin 3 (three) times daily.  . metFORMIN (GLUCOPHAGE) 1000 MG tablet    Sig: Take 1,000 mg by mouth 2 (two) times daily with a meal.  . Insulin Degludec (TRESIBA FLEXTOUCH) 200 UNIT/ML SOPN    Sig: Inject 70 Units into the skin 2 (two) times daily.  . Omega-3 Fatty Acids (FISH OIL) 1200 MG CPDR    Sig: Take 1 capsule by mouth daily.  Marland Kitchen ZINC OXIDE PO    Sig: Take 1 tablet by mouth daily.  . Ascorbic Acid (VITAMIN C) 1000 MG tablet    Sig: Take 1,000 mg by mouth daily.  Marland Kitchen DISCONTD: sildenafil (VIAGRA) 100 MG tablet    Sig: Take 0.5-1 tablets (50-100 mg total) by mouth daily as needed for erectile dysfunction.    Dispense:  10 tablet    Refill:  0  . sildenafil (VIAGRA) 100 MG tablet    Sig: Take 1 tablet (100 mg total) by mouth daily as needed for erectile dysfunction.    Dispense:  90 tablet    Refill:  0    Return precautions advised.  Garret Reddish, MD

## 2016-04-21 NOTE — Patient Instructions (Addendum)
Pneumovax 23 today   We will call you within a week or two about your referral to eye doctor. If you do not hear within 3 weeks, give [place] a call.   Labs before you leave  Trial viagra. Need to stop and seek care if any chest pain or shortness of breath  GU exam

## 2016-04-21 NOTE — Assessment & Plan Note (Signed)
S: Left BKA 2013. Last a1c in November was 6.2. endocrine [facility] 07/2014. victoza 1.8, metformin 1g BID, lantus 70 BID--> tresiba due to insurance. Humalog 20 units 3x a day. Exercise helps. Nutritionist desired. Lab Results  Component Value Date   HGBA1C 6.2 04/21/2016  A/P: A1c looks great today on current medicine- will continue current dose. Endocrine in past- wants to work with PCP as long as controlled

## 2016-04-22 LAB — URINE CYTOLOGY ANCILLARY ONLY
Chlamydia: NEGATIVE
NEISSERIA GONORRHEA: NEGATIVE
Trichomonas: NEGATIVE

## 2016-04-22 LAB — RPR

## 2016-04-22 LAB — HIV ANTIBODY (ROUTINE TESTING W REFLEX): HIV 1&2 Ab, 4th Generation: NONREACTIVE

## 2016-05-02 ENCOUNTER — Encounter: Payer: Self-pay | Admitting: Family Medicine

## 2016-05-09 ENCOUNTER — Other Ambulatory Visit: Payer: Self-pay

## 2016-05-09 MED ORDER — GLUCOSE BLOOD VI STRP: ORAL_STRIP | 12 refills | Status: DC

## 2016-05-09 MED ORDER — PEN NEEDLES 32G X 4 MM MISC: 1.0000 "pen " | Freq: Every day | 5 refills | Status: DC

## 2016-06-22 ENCOUNTER — Telehealth: Payer: Self-pay | Admitting: Family Medicine

## 2016-06-22 NOTE — Telephone Encounter (Signed)
Pt need new Rx for HUMALOG,TRESIBA,VICTOZA,metformin, ramipril and CRESTOR  Pharm:  [facility] [address].

## 2016-06-23 ENCOUNTER — Other Ambulatory Visit: Payer: Self-pay | Admitting: Family Medicine

## 2016-06-23 MED ORDER — METFORMIN HCL 1000 MG PO TABS: 1000.0000 mg | ORAL_TABLET | Freq: Two times a day (BID) | ORAL | 5 refills | Status: DC

## 2016-06-23 MED ORDER — INSULIN LISPRO 100 UNIT/ML (KWIKPEN): 20.0000 [IU] | PEN_INJECTOR | Freq: Three times a day (TID) | SUBCUTANEOUS | 2 refills | Status: DC

## 2016-06-23 MED ORDER — ROSUVASTATIN CALCIUM 10 MG PO TABS: 10.0000 mg | ORAL_TABLET | Freq: Every day | ORAL | 5 refills | Status: DC

## 2016-06-23 MED ORDER — INSULIN DEGLUDEC 200 UNIT/ML ~~LOC~~ SOPN: 70.0000 [IU] | PEN_INJECTOR | Freq: Two times a day (BID) | SUBCUTANEOUS | 2 refills | Status: DC

## 2016-06-23 MED ORDER — RAMIPRIL 10 MG PO CAPS: 10.0000 mg | ORAL_CAPSULE | Freq: Two times a day (BID) | ORAL | 5 refills | Status: DC

## 2016-06-23 MED ORDER — LIRAGLUTIDE 18 MG/3ML ~~LOC~~ SOPN: 1.8000 mg | PEN_INJECTOR | Freq: Every day | SUBCUTANEOUS | 3 refills | Status: DC

## 2016-06-23 NOTE — Telephone Encounter (Signed)
I refilled victoza but should be 1.8mg  and changed this.

## 2016-06-23 NOTE — Telephone Encounter (Signed)
All medication were refilled except the Victoza just wanted to confirm that pt is taking 18mg . Please advise

## 2016-08-01 ENCOUNTER — Ambulatory Visit: Payer: 59 | Admitting: Family Medicine

## 2016-08-02 ENCOUNTER — Encounter: Payer: Self-pay | Admitting: Family Medicine

## 2016-08-02 ENCOUNTER — Ambulatory Visit (INDEPENDENT_AMBULATORY_CARE_PROVIDER_SITE_OTHER): Payer: 59 | Admitting: Family Medicine

## 2016-08-02 VITALS — BP 118/86 | HR 76 | Temp 97.7°F | Wt 253.4 lb

## 2016-08-02 DIAGNOSIS — E1149 Type 2 diabetes mellitus with other diabetic neurological complication: Secondary | ICD-10-CM

## 2016-08-02 DIAGNOSIS — E7800 Pure hypercholesterolemia, unspecified: Secondary | ICD-10-CM

## 2016-08-02 DIAGNOSIS — I10 Essential (primary) hypertension: Secondary | ICD-10-CM

## 2016-08-02 LAB — BASIC METABOLIC PANEL
BUN: 12 mg/dL (ref 6–23)
CALCIUM: 9.6 mg/dL (ref 8.4–10.5)
CO2: 26 meq/L (ref 19–32)
CREATININE: 0.91 mg/dL (ref 0.40–1.50)
Chloride: 106 mEq/L (ref 96–112)
GFR: 94.32 mL/min (ref >60.00)
GLUCOSE: 92 mg/dL (ref 70–99)
Potassium: 4.4 mEq/L (ref 3.5–5.1)
SODIUM: 138 meq/L (ref 135–145)

## 2016-08-02 LAB — LIPID PANEL
Cholesterol: 121 mg/dL (ref 0–200)
HDL: 38.8 mg/dL — ABNORMAL LOW
LDL Cholesterol: 47 mg/dL (ref 0–99)
NonHDL: 81.73
Total CHOL/HDL Ratio: 3
Triglycerides: 172 mg/dL — ABNORMAL HIGH (ref 0.0–149.0)
VLDL: 34.4 mg/dL (ref 0.0–40.0)

## 2016-08-02 LAB — HEMOGLOBIN A1C: Hgb A1c MFr Bld: 6.5 % (ref 4.6–6.5)

## 2016-08-02 MED ORDER — INSULIN DEGLUDEC 200 UNIT/ML ~~LOC~~ SOPN: 70.0000 [IU] | PEN_INJECTOR | Freq: Two times a day (BID) | SUBCUTANEOUS | 3 refills | Status: DC

## 2016-08-02 NOTE — Progress Notes (Signed)
Subjective:  Luis Lane is a 51 y.o. year old very pleasant male patient who presents for/with See problem oriented charting ROS- No chest pain or shortness of breath. No headache or blurry vision.    Past Medical History-  Patient Active Problem List   Diagnosis Date Noted  . Type 2 diabetes mellitus with neurological complications ([place]) 76/19/5093    Priority: High  . Hyperlipidemia 04/21/2016    Priority: Medium  . Hx of BKA, left ([place]) 04/21/2016    Priority: Medium  . Hypertension 04/21/2016    Priority: Medium  . Diabetic neuropathy ([place]) 04/21/2016    Priority: Medium  . Former smoker 04/21/2016    Priority: Low  . Erectile dysfunction 04/21/2016    Priority: Low    Medications- reviewed and updated Current Outpatient Prescriptions  Medication Sig Dispense Refill  . Ascorbic Acid (VITAMIN C) 1000 MG tablet Take 1,000 mg by mouth daily.    Marland Kitchen glucose blood test strip Use to test blood sugar three times a day & PRN E11.9 One Touch Ultra 100 each 12  . Insulin Degludec (TRESIBA FLEXTOUCH) 200 UNIT/ML SOPN Inject 70 Units into the skin 2 (two) times daily. 21 mL 3  . insulin lispro (HUMALOG KWIKPEN) 100 UNIT/ML KiwkPen Inject 0.2 mLs (20 Units total) into the skin 3 (three) times daily. 15 mL 2  . Insulin Pen Needle (PEN NEEDLES) 32G X 4 MM MISC 1 pen by Does not apply route 5 (five) times daily. E11.9 200 each 5  . liraglutide (VICTOZA) 18 MG/3ML SOPN Inject 0.3 mLs (1.8 mg total) into the skin daily. 9 mL 3  . metFORMIN (GLUCOPHAGE) 1000 MG tablet Take 1 tablet (1,000 mg total) by mouth 2 (two) times daily with a meal. 60 tablet 5  . Omega-3 Fatty Acids (FISH OIL) 1200 MG CPDR Take 1 capsule by mouth daily.    . ramipril (ALTACE) 10 MG capsule Take 1 capsule (10 mg total) by mouth 2 (two) times daily. 60 capsule 5  . rosuvastatin (CRESTOR) 10 MG tablet Take 1 tablet (10 mg total) by mouth daily. 30 tablet 5  . sildenafil (VIAGRA) 100 MG tablet Take 1 tablet (100 mg total)  by mouth daily as needed for erectile dysfunction. 90 tablet 0  . ZINC OXIDE PO Take 1 tablet by mouth daily.     No current facility-administered medications for this visit.     Objective: BP 118/86 (BP Location: Left Arm, Patient Position: Sitting, Cuff Size: Normal)   Pulse 76   Temp 97.7 F (36.5 C) (Oral)   Wt 253 lb 6.4 oz (114.9 kg)   SpO2 98%   BMI 30.64 kg/m  Gen: NAD, resting comfortably CV: RRR no murmurs rubs or gallops Lungs: CTAB no crackles, wheeze, rhonchi Abdomen: soft/nontender/nondistended/normal bowel sounds. overweight Ext: no edema on right, prosthetic on left Skin: warm, dry Neuro: grossly normal, moves all extremities  Assessment/Plan:  Hypertension S: controlled on Ramipril 10mg  BID.  BP Readings from Last 3 Encounters:  08/02/16 118/86  04/21/16 114/86  A/P:Continue current meds:  Doing well  Type 2 diabetes mellitus with neurological complications ([place]) S: controlled. On victoza 1.8mg , metformin 1g BID, tresiba 60 units BID, humalog 3x a day mostly at 20 CBGs- fasting CBGs anywhere from 100-140 (had been trying to go down on dose).  Has had about a low a month (or feeling of a low)- after missing a meal or not eating quick enough. Nothing below 70. Able to eat and resolve issue  Exercise and diet-  has been doing protein shake meal replacement in the morning.  Down 7 lbs with this change.  Walking half a mile 3 days a week.  Lab Results  Component Value Date   HGBA1C 6.2 04/21/2016   HGBA1C (H) 07/29/2009    8.7    HGBA1C (H) 05/29/2008    7.5   A/P: update a1c today. Consider dm education if a1c gets above 7.5 or 8  Hyperlipidemia S: states was controlled on crestor 10mg  last check. No myalgias. LDL ok last visit but wants full lipid panel A/P: update lipid panel   Return in about 14 weeks (around 11/08/2016) for follow up- or sooner if needed. Gave # for [facility] where we referred last time  Orders Placed This Encounter   Procedures  . Lipid panel    [place]    Order Specific Question:   Has the patient fasted?    Answer:   No  . Basic metabolic panel    [place]    Order Specific Question:   Has the patient fasted?    Answer:   No  . Hemoglobin A1c    [place]    Meds ordered this encounter  Medications  . Insulin Degludec (TRESIBA FLEXTOUCH) 200 UNIT/ML SOPN    Sig: Inject 70 Units into the skin 2 (two) times daily.    Dispense:  21 mL    Refill:  3    Return precautions advised.  Garret Reddish, MD

## 2016-08-02 NOTE — Assessment & Plan Note (Signed)
S: controlled. On victoza 1.8mg , metformin 1g BID, tresiba 60 units BID, humalog 3x a day mostly at 20 CBGs- fasting CBGs anywhere from 100-140 (had been trying to go down on dose).  Has had about a low a month (or feeling of a low)- after missing a meal or not eating quick enough. Nothing below 70. Able to eat and resolve issue  Exercise and diet-  has been doing protein shake meal replacement in the morning.  Down 7 lbs with this change.  Walking half a mile 3 days a week.  Lab Results  Component Value Date   HGBA1C 6.2 04/21/2016   HGBA1C (H) 07/29/2009    8.7    HGBA1C (H) 05/29/2008    7.5   A/P: update a1c today. Consider dm education if a1c gets above 7.5 or 8

## 2016-08-02 NOTE — Patient Instructions (Addendum)
Please stop by lab before you go   ___________________________________________________ WE NOW OFFER   [facility] FAST TRACK!!!  SAME DAY Appointments for ACUTE CARE  Such as: Sprains, Injuries, cuts, abrasions, rashes, muscle pain, joint pain, back pain Colds, flu, sore throats, headache, allergies, cough, fever  Ear pain, sinus and eye infections Abdominal pain, nausea, vomiting, diarrhea, upset stomach Animal/insect bites  3 Easy Ways to Schedule: Walk-In Scheduling Call in scheduling Mychart Sign-up: [email or web address]

## 2016-08-02 NOTE — Assessment & Plan Note (Addendum)
S: states was controlled on crestor 10mg  last check. No myalgias. LDL ok last visit but wants full lipid panel A/P: update lipid panel

## 2016-09-23 ENCOUNTER — Encounter: Payer: Self-pay | Admitting: Family Medicine

## 2016-09-23 ENCOUNTER — Ambulatory Visit (INDEPENDENT_AMBULATORY_CARE_PROVIDER_SITE_OTHER): Payer: 59 | Admitting: Family Medicine

## 2016-09-23 VITALS — BP 124/80 | HR 93 | Resp 12 | Ht 76.25 in | Wt 253.2 lb

## 2016-09-23 DIAGNOSIS — D1801 Hemangioma of skin and subcutaneous tissue: Secondary | ICD-10-CM | POA: Diagnosis not present

## 2016-09-23 DIAGNOSIS — I781 Nevus, non-neoplastic: Secondary | ICD-10-CM

## 2016-09-23 DIAGNOSIS — K0889 Other specified disorders of teeth and supporting structures: Secondary | ICD-10-CM

## 2016-09-23 MED ORDER — AMOXICILLIN 500 MG PO CAPS: 500.0000 mg | ORAL_CAPSULE | Freq: Three times a day (TID) | ORAL | 0 refills | Status: AC

## 2016-09-23 NOTE — Patient Instructions (Signed)
  [name] I have seen you today for an acute visit.  A few things to remember from today's visit:   Toothache    Cherry moles are benign. Still sun screening and avoid direct sun exposure.  Keep appt with dentists.   Medications prescribed today are intended for short period of time and will not be refill upon request, a follow up appointment might be necessary to discuss continuation of of treatment if appropriate.     In general please monitor for signs of worsening symptoms and seek immediate medical attention if any concerning.

## 2016-09-23 NOTE — Progress Notes (Signed)
HPI:   ACUTE VISIT:  Chief Complaint  Patient presents with  . Dental Pain    [name] is a 51 y.o. male, who is here today complaining of 1-2 days of toothache, yesterday tooth broke, no Hx of trauma. He noted mild edema but it is better today. He has had other teeth broken like this one, attributed to "a lot of filling" material.  He already has a dental appt on 10/03/16 and would like to start abx treatment. He denies fatigue,chills, odynophagia, or gum purulent drainage.   Dental Pain   This is a new problem. The current episode started yesterday. The problem occurs constantly. The problem has been gradually improving. The pain is at a severity of 6/10. The pain is moderate. Associated symptoms include sinus pressure. Pertinent negatives include no difficulty swallowing, facial pain, fever, oral bleeding or thermal sensitivity. He has tried nothing for the symptoms.    Hx of DM 2.  Lab Results  Component Value Date   HGBA1C 6.5 08/02/2016   He is also concerned about some "skin spots", noted about 3-4 months ago, denies any lesion bleeding or tenderness.  He has not noted major changes or growth. No personal history of skin cancer but reporting that his father does, he is not sure about the type of skin cancer.   Review of Systems  Constitutional: Negative for chills, fatigue and fever.  HENT: Positive for dental problem and sinus pressure. Negative for drooling, facial swelling, mouth sores, sore throat and trouble swallowing.   Respiratory: Negative for stridor.   Musculoskeletal: Negative for gait problem and myalgias.  Skin: Negative for color change and wound.  Neurological: Negative for syncope and headaches.  Hematological: Negative for adenopathy. Does not bruise/bleed easily.  Psychiatric/Behavioral: Negative for confusion.      Current Outpatient Prescriptions on File Prior to Visit  Medication Sig Dispense Refill  . Ascorbic Acid (VITAMIN C)  1000 MG tablet Take 1,000 mg by mouth daily.    Marland Kitchen glucose blood test strip Use to test blood sugar three times a day & PRN E11.9 One Touch Ultra 100 each 12  . Insulin Degludec (TRESIBA FLEXTOUCH) 200 UNIT/ML SOPN Inject 70 Units into the skin 2 (two) times daily. 21 mL 3  . insulin lispro (HUMALOG KWIKPEN) 100 UNIT/ML KiwkPen Inject 0.2 mLs (20 Units total) into the skin 3 (three) times daily. 15 mL 2  . Insulin Pen Needle (PEN NEEDLES) 32G X 4 MM MISC 1 pen by Does not apply route 5 (five) times daily. E11.9 200 each 5  . liraglutide (VICTOZA) 18 MG/3ML SOPN Inject 0.3 mLs (1.8 mg total) into the skin daily. 9 mL 3  . metFORMIN (GLUCOPHAGE) 1000 MG tablet Take 1 tablet (1,000 mg total) by mouth 2 (two) times daily with a meal. 60 tablet 5  . Omega-3 Fatty Acids (FISH OIL) 1200 MG CPDR Take 1 capsule by mouth daily.    . ramipril (ALTACE) 10 MG capsule Take 1 capsule (10 mg total) by mouth 2 (two) times daily. 60 capsule 5  . rosuvastatin (CRESTOR) 10 MG tablet Take 1 tablet (10 mg total) by mouth daily. 30 tablet 5  . sildenafil (VIAGRA) 100 MG tablet Take 1 tablet (100 mg total) by mouth daily as needed for erectile dysfunction. 90 tablet 0  . ZINC OXIDE PO Take 1 tablet by mouth daily.     No current facility-administered medications on file prior to visit.  Past Medical History:  Diagnosis Date  . Chicken pox   . Diabetes mellitus type II, controlled ([place]) 04/21/2016   Left BKA 2013. Last a1c in November was 6.2. [facility] 07/2014. victoza 1.8, metformin 1g BID, lantus 70 BID--> tresiba due to insurance. Humalog 20 units 3x a day. Exercise helps. Nutritionist desired.   Marland Kitchen Hx of BKA, left ([place]) 04/21/2016   2013 duda  . Hyperlipidemia 04/21/2016   crestor 10mg , fish oil. Allergic to aspirin even 81mg - hives.   . Hypertension 04/21/2016   Ramipril 10mg  BID   Allergies  Allergen Reactions  . Asa [Aspirin] Hives and Shortness Of Breath  . Morphine And Related Hives and Shortness Of  Breath    Social History   Social History  . Marital status: Divorced    Spouse name: N/A  . Number of children: N/A  . Years of education: N/A   Social History Main Topics  . Smoking status: Former Smoker    Packs/day: 1.00    Years: 15.00    Quit date: 03/21/2010  . Smokeless tobacco: Never Used  . Alcohol use No  . Drug use: No  . Sexual activity: Not Asked   Other Topics Concern  . None   Social History Narrative   Divorced. Lives with parents. 3 children. Oldest son 63- in [place] in school. Kids lives in [place].    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  Physical Exam  Nursing note and vitals reviewed. Constitutional: He is oriented to person, place, and time. He appears well-developed. He does not appear ill. No distress.  HENT:  Head: Atraumatic.  Nose: Right sinus exhibits no maxillary sinus tenderness and no frontal sinus tenderness. Left sinus exhibits no maxillary sinus tenderness and no frontal sinus tenderness.  Mouth/Throat: Uvula is midline, oropharynx is clear and moist and mucous membranes are normal. No oral lesions. No trismus in the jaw. Abnormal dentition. No uvula swelling.    Partial broken tooth, mild gum tenderness and erythema. No edema or drainage.  Eyes: Conjunctivae are normal.  Respiratory: Effort normal. No respiratory distress.  Musculoskeletal: He exhibits no edema.  Lymphadenopathy:       Head (right side): No submandibular adenopathy present.       Head (left side): No submandibular and no preauricular adenopathy present.    He has no cervical adenopathy.  Neurological: He is alert and oriented to person, place, and time. Gait normal.    Skin: Skin is warm. Lesion noted. No ecchymosis and no rash noted. No erythema.  Smaller About 1-2 mm red, rounded lesion on LUQ. Similar lesions scattered on abdomen and upper extremities. No suspicious lesions appreciated.  Psychiatric: He has a normal mood and affect. His speech is normal.  Well groomed, good eye contact.    ASSESSMENT AND PLAN:   Mr. Lemon was seen today for dental pain.  Diagnoses and all orders for this visit:  Toothache  I don't appreciate clear signs of infection but given his hx of diabetes I recommended starting Amoxicillin. Clearly instructed about warning signs. Keep dental appointment. F/U as needed.  Marcelline Mates  angioma  Educated about Dx. Sun screen and avoidance of direct sun exposure encouraged. Continue monitoring lesion for changes and f/u as needed.   Other orders -     amoxicillin (AMOXIL) 500 MG capsule; Take 1 capsule (500 mg total) by mouth 3 (three) times daily.     Cynthea Zachman G. [place], MD  [facility] [place] office.

## 2016-09-30 LAB — HM DIABETES EYE EXAM

## 2016-10-05 ENCOUNTER — Encounter: Payer: Self-pay | Admitting: Family Medicine

## 2016-11-04 ENCOUNTER — Other Ambulatory Visit: Payer: Self-pay | Admitting: Family Medicine

## 2016-11-11 ENCOUNTER — Ambulatory Visit (INDEPENDENT_AMBULATORY_CARE_PROVIDER_SITE_OTHER): Payer: 59 | Admitting: Family Medicine

## 2016-11-11 ENCOUNTER — Encounter: Payer: Self-pay | Admitting: Family Medicine

## 2016-11-11 VITALS — BP 118/84 | HR 82 | Temp 98.5°F | Wt 250.0 lb

## 2016-11-11 DIAGNOSIS — I10 Essential (primary) hypertension: Secondary | ICD-10-CM

## 2016-11-11 DIAGNOSIS — E785 Hyperlipidemia, unspecified: Secondary | ICD-10-CM

## 2016-11-11 DIAGNOSIS — E1149 Type 2 diabetes mellitus with other diabetic neurological complication: Secondary | ICD-10-CM

## 2016-11-11 DIAGNOSIS — Z23 Encounter for immunization: Secondary | ICD-10-CM

## 2016-11-11 LAB — HEMOGLOBIN A1C: Hgb A1c MFr Bld: 6.6 % — ABNORMAL HIGH (ref 4.6–6.5)

## 2016-11-11 NOTE — Assessment & Plan Note (Signed)
S: well controlled on crestor 10mg  with LDL <70. No myalgias.  A/P: continue current rx

## 2016-11-11 NOTE — Assessment & Plan Note (Signed)
S:  controlled. On victoza 1.8mg , metformin 1g BID, tresiba 60 units BID, humalog 3x a day mainly at 20 units CBGs-  100 this AM. 200 when missed insulin.  Exercise and diet- weight down another 3 lbs since last visit. Working on exercise (has been lower than desired) but has eaten better Lab Results  Component Value Date   HGBA1C 6.5 08/02/2016   HGBA1C 6.2 04/21/2016   A/P: update a1c today

## 2016-11-11 NOTE — Progress Notes (Signed)
Subjective:  Luis Lane is a 51 y.o. year old very pleasant male patient who presents for/with See problem oriented charting ROS- rare low blood sugar if misses meal- easy for him to correct, No chest pain or shortness of breath. No headache or blurry vision.    Past Medical History-  Patient Active Problem List   Diagnosis Date Noted  . Type 2 diabetes mellitus with neurological complications (Luis Lane) 09/32/3557    Priority: High  . Hyperlipidemia 04/21/2016    Priority: Medium  . Hx of BKA, left (Luis Lane) 04/21/2016    Priority: Medium  . Hypertension 04/21/2016    Priority: Medium  . Diabetic neuropathy (Luis Lane) 04/21/2016    Priority: Medium  . Former smoker 04/21/2016    Priority: Low  . Erectile dysfunction 04/21/2016    Priority: Low    Medications- reviewed and updated Current Outpatient Prescriptions  Medication Sig Dispense Refill  . Ascorbic Acid (VITAMIN C) 1000 MG tablet Take 1,000 mg by mouth daily.    Marland Kitchen glucose blood test strip Use to test blood sugar three times a day & PRN E11.9 One Touch Ultra 100 each 12  . HUMALOG KWIKPEN 100 UNIT/ML KiwkPen INJECT 20 UNITS INTO THE SKIN 3 TIMES DAILY 15 mL 1  . Insulin Degludec (TRESIBA FLEXTOUCH) 200 UNIT/ML SOPN Inject 70 Units into the skin 2 (two) times daily. 21 mL 3  . Insulin Pen Needle (PEN NEEDLES) 32G X 4 MM MISC 1 pen by Does not apply route 5 (five) times daily. E11.9 200 each 5  . liraglutide (VICTOZA) 18 MG/3ML SOPN Inject 0.3 mLs (1.8 mg total) into the skin daily. 9 mL 3  . metFORMIN (GLUCOPHAGE) 1000 MG tablet Take 1 tablet (1,000 mg total) by mouth 2 (two) times daily with a meal. 60 tablet 5  . Omega-3 Fatty Acids (FISH OIL) 1200 MG CPDR Take 1 capsule by mouth daily.    . ramipril (ALTACE) 10 MG capsule Take 1 capsule (10 mg total) by mouth 2 (two) times daily. 60 capsule 5  . rosuvastatin (CRESTOR) 10 MG tablet Take 1 tablet (10 mg total) by mouth daily. 30 tablet 5  . sildenafil (VIAGRA) 100 MG tablet Take 1  tablet (100 mg total) by mouth daily as needed for erectile dysfunction. 90 tablet 0  . ZINC OXIDE PO Take 1 tablet by mouth daily.     No current facility-administered medications for this visit.     Objective: BP 118/84 (BP Location: Left Arm, Patient Position: Sitting, Cuff Size: Normal)   Pulse 82   Temp 98.5 F (36.9 C) (Oral)   Wt 250 lb (113.4 kg)   SpO2 97%   BMI 30.23 kg/m  Gen: NAD, resting comfortably CV: RRR no murmurs rubs or gallops Lungs: CTAB no crackles, wheeze, rhonchi Abdomen: soft/nontender/nondistended Ext: no edema on right, left BKA Skin: warm, dry Neuro: grossly normal, moves all extremities  Assessment/Plan:  Hypertension S: controlled on ramipril 10mg  BID.  BP Readings from Last 3 Encounters:  11/11/16 118/84  09/23/16 124/80  08/02/16 118/86  A/P: We discussed blood pressure goal of <140/90. Continue current meds  Hyperlipidemia S: well controlled on crestor 10mg  with LDL <70. No myalgias.  A/P: continue current rx  Type 2 diabetes mellitus with neurological complications (Luis Lane) S:  controlled. On victoza 1.8mg , metformin 1g BID, tresiba 60 units BID, humalog 3x a day mainly at 20 units CBGs-  100 this AM. 200 when missed insulin.  Exercise and diet- weight down another 3  lbs since last visit. Working on exercise (has been lower than desired) but has eaten better Lab Results  Component Value Date   HGBA1C 6.5 08/02/2016   HGBA1C 6.2 04/21/2016   A/P: update a1c today  4 months.  Flu shot today.  tdap reports 3 years ago- will need to explore records.   Orders Placed This Encounter  Procedures  . Hemoglobin A1c    [place]   Return precautions advised.  Garret Reddish, MD

## 2016-11-11 NOTE — Patient Instructions (Addendum)
Wt Readings from Last 3 Encounters:  11/11/16 250 lb (113.4 kg)  09/23/16 253 lb 4 oz (114.9 kg)  08/02/16 253 lb 6.4 oz (114.9 kg)  Great job losing another 3 lbs! Keep up the good work  Hopeful a1c 6.5 or less  No changes to meds today planned unless a1c changes this  Health Maintenance Due  Topic Date Due  . TETANUS/TDAP - today? Had 3 years ago 02/05/1987  . INFLUENZA VACCINE - today 10/19/2016

## 2016-11-11 NOTE — Addendum Note (Signed)
Addended by: Wyvonne Lenz on: 11/11/2016 10:37 AM   Modules accepted: Orders

## 2016-11-21 ENCOUNTER — Telehealth: Payer: 59 | Admitting: Family

## 2016-11-21 DIAGNOSIS — H109 Unspecified conjunctivitis: Secondary | ICD-10-CM

## 2016-11-21 MED ORDER — POLYMYXIN B-TRIMETHOPRIM 10000-0.1 UNIT/ML-% OP SOLN: 1.0000 [drp] | Freq: Four times a day (QID) | OPHTHALMIC | 0 refills | Status: DC

## 2016-11-21 NOTE — Progress Notes (Signed)

## 2016-12-17 ENCOUNTER — Other Ambulatory Visit: Payer: Self-pay | Admitting: Family Medicine

## 2016-12-23 ENCOUNTER — Other Ambulatory Visit: Payer: Self-pay | Admitting: Family Medicine

## 2017-01-01 ENCOUNTER — Other Ambulatory Visit: Payer: Self-pay | Admitting: Family Medicine

## 2017-01-14 ENCOUNTER — Other Ambulatory Visit: Payer: Self-pay | Admitting: Family Medicine

## 2017-01-24 ENCOUNTER — Ambulatory Visit (INDEPENDENT_AMBULATORY_CARE_PROVIDER_SITE_OTHER): Payer: 59 | Admitting: Orthopedic Surgery

## 2017-01-24 ENCOUNTER — Encounter (INDEPENDENT_AMBULATORY_CARE_PROVIDER_SITE_OTHER): Payer: Self-pay | Admitting: Orthopedic Surgery

## 2017-01-24 VITALS — Ht 76.25 in | Wt 250.0 lb

## 2017-01-24 DIAGNOSIS — L97511 Non-pressure chronic ulcer of other part of right foot limited to breakdown of skin: Secondary | ICD-10-CM

## 2017-01-24 DIAGNOSIS — E1149 Type 2 diabetes mellitus with other diabetic neurological complication: Secondary | ICD-10-CM

## 2017-01-24 NOTE — Progress Notes (Signed)
Office Visit Note   Patient: Luis Lane           Date of Birth: 06-22-1965           MRN: [id number] Visit Date: 01/24/2017              Requested by: Luis Olp, MD [address], [place] [id number] PCP: Luis Olp, MD  Chief Complaint  Patient presents with  . Right Foot - Wound Check      HPI: The patient is a 51 year old gentleman seen today for evaluation of a new ulcer to the right foot. Is beneath 3 MT head. Had been out of town in [place], states feet got wet and did quite a bit of walking. Noticed a blister beneath ball of foot. No pain associated. Did open this up and was able to express some white drainage. Has been treating with neosporin. States is wondering if Dr. Sharol Lane would like to shave off some of his MT head so he doesn't develop ulcers.  Assessment & Plan: Visit Diagnoses:  1. Type 2 diabetes mellitus with neurological complications (Luis Lane)   2. Right foot ulcer, limited to breakdown of skin (Luis Lane)     Plan: begin daily dial soap cleansing. Apply Antibacterial ointment dressings. Provided felt pressure relieving donut for shoewear. Minimize weight bearing on right. Follow up in office in 2 weeks.   Follow-Up Instructions: Return in about 2 weeks (around 02/07/2017) for duda.   Ortho Exam  Patient is alert, oriented, no adenopathy, well-dressed, normal affect, normal respiratory effort. Right foot: has fixed clawing of second and 3rd toes. Callused ulceration beneath 3rd MT head. This was pared with 10 blade knife back to viable tissue. Ulcer after debridement is 11 mm in diameter and 1 mm deep. Scant serous drainage. No odor. No surrounding erythema no sign of infection. Does not have protective sensation.   Imaging: No results found. No images are attached to the encounter.  Labs: Lab Results  Component Value Date   HGBA1C 6.6 (H) 11/11/2016   HGBA1C 6.5 08/02/2016   HGBA1C 6.2 04/21/2016   ESRSEDRATE 19 (H) 11/05/2006     REPTSTATUS 08/01/2009 FINAL 07/29/2009   GRAMSTAIN  07/29/2009    MODERATE WBC PRESENT, PREDOMINANTLY PMN RARE SQUAMOUS EPITHELIAL CELLS PRESENT MODERATE GRAM POSITIVE COCCI IN PAIRS MODERATE GRAM NEGATIVE RODS   CULT  07/29/2009    MODERATE GROUP B STREP(S.AGALACTIAE)ISOLATED Note: TESTING AGAINST S. AGALACTIAE NOT ROUTINELY PERFORMED DUE TO PREDICTABILITY OF AMP/PEN/VAN SUSCEPTIBILITY.    Orders:  No orders of the defined types were placed in this encounter.  No orders of the defined types were placed in this encounter.    Procedures: No procedures performed  Clinical Data: No additional findings.  ROS:  All other systems negative, except as noted in the HPI. Review of Systems  Constitutional: Negative for chills and fever.  Skin: Positive for wound. Negative for color change.    Objective: Vital Signs: Ht 6' 4.25" (1.937 m)   Wt 250 lb (113.4 kg)   BMI 30.23 kg/m   Specialty Comments:  No specialty comments available.  PMFS History: Patient Active Problem List   Diagnosis Date Noted  . Right foot ulcer, limited to breakdown of skin (Luis Lane) 01/24/2017  . Hyperlipidemia 04/21/2016  . Type 2 diabetes mellitus with neurological complications ([place]) 93/81/0175  . Hx of BKA, left (Luis Lane) 04/21/2016  . Hypertension 04/21/2016  . Former smoker 04/21/2016  . Erectile dysfunction 04/21/2016  .  Diabetic neuropathy (Luis Lane) 04/21/2016   Past Medical History:  Diagnosis Date  . Chicken pox   . Diabetes mellitus type II, controlled ([place]) 04/21/2016   Left BKA 2013. Last a1c in November was 6.2. [facility] 07/2014. victoza 1.8, metformin 1g BID, lantus 70 BID--> tresiba due to insurance. Humalog 20 units 3x a day. Exercise helps. Nutritionist desired.   Marland Kitchen Hx of BKA, left (Luis Lane) 04/21/2016   2013 duda  . Hyperlipidemia 04/21/2016   crestor 10mg , fish oil. Allergic to aspirin even 81mg - hives.   . Hypertension 04/21/2016   Ramipril 10mg  BID    Family History  Problem  Relation Age of Onset  . Diabetes Mother        pt at Luis Lane  . Ovarian cancer Mother   . Hypertension Mother   . Diabetes Father   . Diabetes Brother   . Diabetic kidney disease Brother         and retinopathy  . Alcohol abuse Maternal Grandmother        and some uncles. moms side  . Stroke Paternal Grandmother        smoker as well  . Heart attack Paternal Grandmother   . Diabetes Paternal Grandmother     Past Surgical History:  Procedure Laterality Date  . AMPUTATION     3rd toe right  . APPENDECTOMY     1974  . Left Leg Amputee Left 2013   Social History   Occupational History  . Not on file  Tobacco Use  . Smoking status: Former Smoker    Packs/day: 1.00    Years: 15.00    Pack years: 15.00    Last attempt to quit: 03/21/2010    Years since quitting: 6.8  . Smokeless tobacco: Never Used  Substance and Sexual Activity  . Alcohol use: No  . Drug use: No  . Sexual activity: Not on file

## 2017-02-07 ENCOUNTER — Encounter (INDEPENDENT_AMBULATORY_CARE_PROVIDER_SITE_OTHER): Payer: Self-pay | Admitting: Orthopedic Surgery

## 2017-02-07 ENCOUNTER — Ambulatory Visit (INDEPENDENT_AMBULATORY_CARE_PROVIDER_SITE_OTHER): Payer: 59 | Admitting: Orthopedic Surgery

## 2017-02-07 VITALS — Ht 76.0 in | Wt 250.0 lb

## 2017-02-07 DIAGNOSIS — L97511 Non-pressure chronic ulcer of other part of right foot limited to breakdown of skin: Secondary | ICD-10-CM

## 2017-02-07 DIAGNOSIS — Z89512 Acquired absence of left leg below knee: Secondary | ICD-10-CM | POA: Diagnosis not present

## 2017-02-07 NOTE — Progress Notes (Signed)
Office Visit Note   Patient: Luis Lane           Date of Birth: 1965-04-28           MRN: [id number] Visit Date: 02/07/2017              Requested by: Marin Olp, MD [place], [place] [address] PCP: Marin Olp, MD  Chief Complaint  Patient presents with  . Right Foot - Follow-up    3rd MTH ulcer      HPI: Patient is a 51 year old gentleman is seen in follow-up for a Waggoner grade 1 ulcer beneath the third metatarsal head of the right foot he is status post a left transtibial amputation he denies any pain with his prosthesis.  Patient states he has been using antibiotic ointment Dial soap cleansing.  Assessment & Plan: Visit Diagnoses:  1. Right foot ulcer, limited to breakdown of skin ([place])   2. S/P BKA (below knee amputation) unilateral, left ([place])     Plan: Recommended continue with protective shoe wear continue with heel cord stretching follow-up as needed.  Discussed metatarsal head osteotomies to unload pressure if necessary.  Follow-Up Instructions: Return if symptoms worsen or fail to improve.   Ortho Exam  Patient is alert, oriented, no adenopathy, well-dressed, normal affect, normal respiratory effort. Patient patient has a good dorsalis pedis pulse on the right.  He has good ankle motion dorsiflexion to neutral.  He has a Waggoner grade 1 ulcer beneath the third metatarsal head there is no redness no cellulitis no drainage no odor no signs of infection.  After informed consent a 10 blade knife was used to debride the skin and soft tissue back to healthy viable granulation tissue this was touched with silver nitrate.  A Band-Aid was applied the ulcer is 10 mm in diameter 1 mm deep.  Imaging: No results found. No images are attached to the encounter.  Labs: Lab Results  Component Value Date   HGBA1C 6.6 (H) 11/11/2016   HGBA1C 6.5 08/02/2016   HGBA1C 6.2 04/21/2016   ESRSEDRATE 19 (H) 11/05/2006   REPTSTATUS 08/01/2009 FINAL  07/29/2009   GRAMSTAIN  07/29/2009    MODERATE WBC PRESENT, PREDOMINANTLY PMN RARE SQUAMOUS EPITHELIAL CELLS PRESENT MODERATE GRAM POSITIVE COCCI IN PAIRS MODERATE GRAM NEGATIVE RODS   CULT  07/29/2009    MODERATE GROUP B STREP(S.AGALACTIAE)ISOLATED Note: TESTING AGAINST S. AGALACTIAE NOT ROUTINELY PERFORMED DUE TO PREDICTABILITY OF AMP/PEN/VAN SUSCEPTIBILITY.    @LABSALLVALUES (HGBA1)@  @BMI1 @  Orders:  No orders of the defined types were placed in this encounter.  No orders of the defined types were placed in this encounter.    Procedures: No procedures performed  Clinical Data: No additional findings.  ROS:  All other systems negative, except as noted in the HPI. Review of Systems  Objective: Vital Signs: Ht 6\' 4"  (1.93 m)   Wt 250 lb (113.4 kg)   BMI 30.43 kg/m   Specialty Comments:  No specialty comments available.  PMFS History: Patient Active Problem List   Diagnosis Date Noted  . Right foot ulcer, limited to breakdown of skin ([place]) 01/24/2017  . Hyperlipidemia 04/21/2016  . Type 2 diabetes mellitus with neurological complications ([place]) 26/20/3559  . S/P BKA (below knee amputation) unilateral, left ([place]) 04/21/2016  . Hypertension 04/21/2016  . Former smoker 04/21/2016  . Erectile dysfunction 04/21/2016  . Diabetic neuropathy ([place]) 04/21/2016   Past Medical History:  Diagnosis Date  . Chicken pox   .  Diabetes mellitus type II, controlled ([place]) 04/21/2016   Left BKA 2013. Last a1c in November was 6.2. [facility] 07/2014. victoza 1.8, metformin 1g BID, lantus 70 BID--> tresiba due to insurance. Humalog 20 units 3x a day. Exercise helps. Nutritionist desired.   Marland Kitchen Hx of BKA, left ([place]) 04/21/2016   2013 [place]  . Hyperlipidemia 04/21/2016   crestor 10mg , fish oil. Allergic to aspirin even 81mg - hives.   . Hypertension 04/21/2016   Ramipril 10mg  BID    Family History  Problem Relation Age of Onset  . Diabetes Mother        pt at [facility]  . Ovarian  cancer Mother   . Hypertension Mother   . Diabetes Father   . Diabetes Brother   . Diabetic kidney disease Brother         and retinopathy  . Alcohol abuse Maternal Grandmother        and some uncles. moms side  . Stroke Paternal Grandmother        smoker as well  . Heart attack Paternal Grandmother   . Diabetes Paternal Grandmother     Past Surgical History:  Procedure Laterality Date  . AMPUTATION     3rd toe right  . APPENDECTOMY     1974  . Left Leg Amputee Left 2013   Social History   Occupational History  . Not on file  Tobacco Use  . Smoking status: Former Smoker    Packs/day: 1.00    Years: 15.00    Pack years: 15.00    Last attempt to quit: 03/21/2010    Years since quitting: 6.8  . Smokeless tobacco: Never Used  Substance and Sexual Activity  . Alcohol use: No  . Drug use: No  . Sexual activity: Not on file

## 2017-02-10 ENCOUNTER — Other Ambulatory Visit: Payer: Self-pay | Admitting: Family Medicine

## 2017-03-10 ENCOUNTER — Ambulatory Visit: Payer: 59 | Admitting: Family Medicine

## 2017-03-10 ENCOUNTER — Encounter: Payer: Self-pay | Admitting: Family Medicine

## 2017-03-10 VITALS — BP 102/78 | HR 73 | Temp 97.9°F | Ht 76.0 in | Wt 248.8 lb

## 2017-03-10 DIAGNOSIS — E785 Hyperlipidemia, unspecified: Secondary | ICD-10-CM | POA: Diagnosis not present

## 2017-03-10 DIAGNOSIS — E1149 Type 2 diabetes mellitus with other diabetic neurological complication: Secondary | ICD-10-CM

## 2017-03-10 DIAGNOSIS — Z89512 Acquired absence of left leg below knee: Secondary | ICD-10-CM | POA: Diagnosis not present

## 2017-03-10 DIAGNOSIS — I10 Essential (primary) hypertension: Secondary | ICD-10-CM

## 2017-03-10 DIAGNOSIS — Z23 Encounter for immunization: Secondary | ICD-10-CM

## 2017-03-10 LAB — COMPREHENSIVE METABOLIC PANEL
ALK PHOS: 63 U/L (ref 39–117)
ALT: 19 U/L (ref 0–53)
AST: 19 U/L (ref 0–37)
Albumin: 4.5 g/dL (ref 3.5–5.2)
BILIRUBIN TOTAL: 0.7 mg/dL (ref 0.2–1.2)
BUN: 10 mg/dL (ref 6–23)
CALCIUM: 9.2 mg/dL (ref 8.4–10.5)
CO2: 24 meq/L (ref 19–32)
CREATININE: 0.85 mg/dL (ref 0.40–1.50)
Chloride: 101 mEq/L (ref 96–112)
GFR: 101.79 mL/min (ref >60.00)
GLUCOSE: 135 mg/dL — AB (ref 70–99)
Potassium: 3.8 mEq/L (ref 3.5–5.1)
Sodium: 136 mEq/L (ref 135–145)
TOTAL PROTEIN: 7.5 g/dL (ref 6.0–8.3)

## 2017-03-10 LAB — HEMOGLOBIN A1C: Hgb A1c MFr Bld: 7.3 % — ABNORMAL HIGH (ref 4.6–6.5)

## 2017-03-10 MED ORDER — AZITHROMYCIN 250 MG PO TABS: ORAL_TABLET | ORAL | 0 refills | Status: DC

## 2017-03-10 MED ORDER — INSULIN DEGLUDEC 200 UNIT/ML ~~LOC~~ SOPN: 70.0000 [IU] | PEN_INJECTOR | Freq: Two times a day (BID) | SUBCUTANEOUS | 1 refills | Status: DC

## 2017-03-10 NOTE — Progress Notes (Signed)
Subjective:  Luis Lane is a 51 y.o. year old very pleasant male patient who presents for/with See problem oriented charting ROS- no hypoglycemia, has had some hyperglycemia since has run out of his long acting insulin. no chest pain or shortness of breath.    Past Medical History-  Patient Active Problem List   Diagnosis Date Noted  . Type 2 diabetes mellitus with neurological complications ([place]) 40/98/1191    Priority: High  . Hyperlipidemia 04/21/2016    Priority: Medium  . S/P BKA (below knee amputation) unilateral, left ([place]) 04/21/2016    Priority: Medium  . Hypertension 04/21/2016    Priority: Medium  . Diabetic neuropathy ([place]) 04/21/2016    Priority: Medium  . Former smoker 04/21/2016    Priority: Low  . Erectile dysfunction 04/21/2016    Priority: Low  . Right foot ulcer, limited to breakdown of skin ([place]) 01/24/2017    Medications- reviewed and updated Current Outpatient Medications  Medication Sig Dispense Refill  . Ascorbic Acid (VITAMIN C) 1000 MG tablet Take 1,000 mg by mouth daily.    Marland Kitchen glucose blood test strip Use to test blood sugar three times a day & PRN E11.9 One Touch Ultra 100 each 12  . HUMALOG KWIKPEN 100 UNIT/ML KiwkPen INJECT 20 UNITS INTO THE SKIN 3 TIMES A DAY 15 pen 0  . Insulin Degludec (TRESIBA FLEXTOUCH) 200 UNIT/ML SOPN Inject 70 Units into the skin 2 (two) times daily. 15 pen 1  . Insulin Pen Needle (PEN NEEDLES) 32G X 4 MM MISC 1 pen by Does not apply route 5 (five) times daily. E11.9 200 each 5  . metFORMIN (GLUCOPHAGE) 1000 MG tablet TAKE 1 TABLET BY MOUTH 2 TIMES DAILY WITH A MEAL 60 tablet 4  . Omega-3 Fatty Acids (FISH OIL) 1200 MG CPDR Take 1 capsule by mouth daily.    . ramipril (ALTACE) 10 MG capsule TAKE 1 CAPSULE BY MOUTH 2 TIMES DAILY 60 capsule 4  . rosuvastatin (CRESTOR) 10 MG tablet TAKE 1 TABLET BY MOUTH DAILY 30 tablet 4  . sildenafil (VIAGRA) 100 MG tablet Take 1 tablet (100 mg total) by mouth daily as needed for erectile  dysfunction. 90 tablet 0  . VICTOZA 18 MG/3ML SOPN DIAL AND INJECT SUBCUTANEOUSLY 1.8MG  DAILY 9 pen 2  . ZINC OXIDE PO Take 1 tablet by mouth daily.    Marland Kitchen azithromycin (ZITHROMAX) 250 MG tablet Take 2 tabs on day 1, then 1 tab daily until finished 6 tablet 0   No current facility-administered medications for this visit.     Objective: BP 102/78 (BP Location: Left Arm, Patient Position: Sitting, Cuff Size: Large)   Pulse 73   Temp 97.9 F (36.6 C) (Oral)   Ht 6\' 4"  (1.93 m)   Wt 248 lb 12.8 oz (112.9 kg)   SpO2 97%   BMI 30.28 kg/m  Gen: appears fatigued, intermittent cough Tm normal on left, obscured on right by cerumen, pharynx very mild erythema, nares mild erythema no drainage CV: RRR no murmurs rubs or gallops Lungs: CTAB no crackles, wheeze, rhonchi Ext: no edema Skin: warm, dry  Assessment/Plan:  Bronchitis S:  on airplane 9 days ago. Coughing up greenish mucus. No sinus pressure. Feels like its in his chest. Has taken robitussin. Has taken zinc as well.   A/P: "Suspect bronchitis with chest congestion and worsening symptoms. If symptoms continue to worsen over next 2 days may take azithromycin. Definitely take it if fever or shortness of breath and if you  do not improve or worsen- return to see [place].   Only about a 5% chance this is bacterial and that the azithromycin will work- if it doesn't work then likely looking at 3-6 weeks of symptoms- once again if symptoms worsen see [place] back"  Hypertension S: controlled on ramipril 10mg  BID.  BP Readings from Last 3 Encounters:  03/10/17 102/78  11/11/16 118/84  09/23/16 124/80  A/P: We discussed blood pressure goal of <140/90. Continue current meds  Type 2 diabetes mellitus with neurological complications ([place]) S: previously controlled. On victoza 1.8mg , metformin 1g BID, tresiba 60 units BID- has been trouble getting enough pens so we increased dose to 70 units with 15 pens though plan is to continue 60 units, humalgo 3x a day  20 units for most part. Unfortunately ran out of tresiba for 2 weeks. Still was taking humalog.  CBGs- 210 to 250 range.  Exercise and diet- weight down another 2 lbs.  Lab Results  Component Value Date   HGBA1C 6.6 (H) 11/11/2016   HGBA1C 6.5 08/02/2016   HGBA1C 6.2 04/21/2016   A/P: Update a1c- may be up a bit with being out of tresiba. Likely to just restart and follow up in 4 months.  Addendum Lab Results  Component Value Date   HGBA1C 7.3 (H) 03/10/2017  no changes. Thrilled hasnt had more of a jump in a1c  S/P BKA (below knee amputation) unilateral, left ([place]) Doing well with his prosthetic- no ulcerations- does not request updated supplies  Hyperlipidemia S:  controlled on crestor 10mg  with LDL under 70 on last check in may. No myalgias.  A/P: continue crestor 10mg   Future Appointments  Date Time Provider [place]  07/10/2017  3:30 PM Marin Olp, MD [facility]   Orders Placed This Encounter  Procedures  . Tdap vaccine greater than or equal to 7yo IM  . Comprehensive metabolic panel    [place]  . Hemoglobin A1c    [place]    Meds ordered this encounter  Medications  . Insulin Degludec (TRESIBA FLEXTOUCH) 200 UNIT/ML SOPN    Sig: Inject 70 Units into the skin 2 (two) times daily.    Dispense:  15 pen    Refill:  1  . azithromycin (ZITHROMAX) 250 MG tablet    Sig: Take 2 tabs on day 1, then 1 tab daily until finished    Dispense:  6 tablet    Refill:  0    Return precautions advised.  Garret Reddish, MD

## 2017-03-10 NOTE — Patient Instructions (Signed)
Please stop by lab before you go  Suspect bronchitis with chest congestion and worsening symptoms. If symptoms continue to worsen over next 2 days may take azithromycin. Definitely take it if fever or shortness of breath and if you do not improve or worsen- return to see [place].   Only about a 5% chance this is bacterial and that the azithromycin will work- if it doesn't work then likely looking at 3-6 weeks of symptoms- once again if symptoms worsen see [place] back

## 2017-03-11 NOTE — Assessment & Plan Note (Signed)
S: controlled on ramipril 10mg  BID.  BP Readings from Last 3 Encounters:  03/10/17 102/78  11/11/16 118/84  09/23/16 124/80  A/P: We discussed blood pressure goal of <140/90. Continue current meds

## 2017-03-11 NOTE — Assessment & Plan Note (Signed)
Doing well with his prosthetic- no ulcerations- does not request updated supplies

## 2017-03-11 NOTE — Assessment & Plan Note (Signed)
S:  controlled on crestor 10mg  with LDL under 70 on last check in may. No myalgias.  A/P: continue crestor 10mg 

## 2017-03-11 NOTE — Assessment & Plan Note (Signed)
S: previously controlled. On victoza 1.8mg , metformin 1g BID, tresiba 60 units BID- has been trouble getting enough pens so we increased dose to 70 units with 15 pens though plan is to continue 60 units, humalgo 3x a day 20 units for most part. Unfortunately ran out of tresiba for 2 weeks. Still was taking humalog.  CBGs- 210 to 250 range.  Exercise and diet- weight down another 2 lbs.  Lab Results  Component Value Date   HGBA1C 6.6 (H) 11/11/2016   HGBA1C 6.5 08/02/2016   HGBA1C 6.2 04/21/2016   A/P: Update a1c- may be up a bit with being out of tresiba. Likely to just restart and follow up in 4 months.  Addendum Lab Results  Component Value Date   HGBA1C 7.3 (H) 03/10/2017  no changes. Thrilled hasnt had more of a jump in a1c

## 2017-03-13 ENCOUNTER — Encounter: Payer: Self-pay | Admitting: Family Medicine

## 2017-03-15 ENCOUNTER — Other Ambulatory Visit: Payer: Self-pay | Admitting: Family Medicine

## 2017-04-28 ENCOUNTER — Other Ambulatory Visit: Payer: Self-pay | Admitting: Family Medicine

## 2017-05-11 ENCOUNTER — Other Ambulatory Visit: Payer: Self-pay | Admitting: Family Medicine

## 2017-06-12 ENCOUNTER — Other Ambulatory Visit: Payer: Self-pay | Admitting: Family Medicine

## 2017-06-13 ENCOUNTER — Other Ambulatory Visit: Payer: Self-pay

## 2017-06-13 ENCOUNTER — Other Ambulatory Visit: Payer: Self-pay | Admitting: Family Medicine

## 2017-06-16 ENCOUNTER — Other Ambulatory Visit: Payer: Self-pay | Admitting: Family Medicine

## 2017-06-28 ENCOUNTER — Other Ambulatory Visit: Payer: Self-pay | Admitting: Family Medicine

## 2017-07-10 ENCOUNTER — Encounter: Payer: Self-pay | Admitting: Family Medicine

## 2017-07-10 ENCOUNTER — Ambulatory Visit: Payer: 59 | Admitting: Family Medicine

## 2017-07-10 VITALS — BP 132/88 | HR 97 | Temp 97.9°F | Ht 76.0 in | Wt 258.8 lb

## 2017-07-10 DIAGNOSIS — E785 Hyperlipidemia, unspecified: Secondary | ICD-10-CM | POA: Diagnosis not present

## 2017-07-10 DIAGNOSIS — E1149 Type 2 diabetes mellitus with other diabetic neurological complication: Secondary | ICD-10-CM | POA: Diagnosis not present

## 2017-07-10 DIAGNOSIS — Z89512 Acquired absence of left leg below knee: Secondary | ICD-10-CM | POA: Diagnosis not present

## 2017-07-10 DIAGNOSIS — I10 Essential (primary) hypertension: Secondary | ICD-10-CM | POA: Diagnosis not present

## 2017-07-10 DIAGNOSIS — L97511 Non-pressure chronic ulcer of other part of right foot limited to breakdown of skin: Secondary | ICD-10-CM

## 2017-07-10 LAB — POCT GLYCOSYLATED HEMOGLOBIN (HGB A1C): Hemoglobin A1C: 7.7

## 2017-07-10 MED ORDER — INSULIN DEGLUDEC 200 UNIT/ML ~~LOC~~ SOPN: 70.0000 [IU] | PEN_INJECTOR | Freq: Two times a day (BID) | SUBCUTANEOUS | 5 refills | Status: DC

## 2017-07-10 MED ORDER — INSULIN LISPRO 100 UNIT/ML (KWIKPEN): PEN_INJECTOR | SUBCUTANEOUS | 1 refills | Status: DC

## 2017-07-10 MED ORDER — INSULIN DEGLUDEC 200 UNIT/ML ~~LOC~~ SOPN: 70.0000 [IU] | PEN_INJECTOR | Freq: Two times a day (BID) | SUBCUTANEOUS | 1 refills | Status: DC

## 2017-07-10 NOTE — Assessment & Plan Note (Signed)
S: controlled on ramipril 10mg  BID BP Readings from Last 3 Encounters:  07/10/17 132/88  03/10/17 102/78  11/11/16 118/84  A/P: We discussed blood pressure goal of <140/90. Continue current meds

## 2017-07-10 NOTE — Progress Notes (Signed)
Subjective:  Luis Lane is a 52 y.o. year old very pleasant male patient who presents for/with See problem oriented charting ROS- no chest pain. No edema right leg. No fever or chills. No worsening/expanding redness near ulceration on right foot.    Past Medical History-  Patient Active Problem List   Diagnosis Date Noted  . Type 2 diabetes mellitus with neurological complications ([place]) 94/17/4081    Priority: High  . Hyperlipidemia 04/21/2016    Priority: Medium  . S/P BKA (below knee amputation) unilateral, left ([place]) 04/21/2016    Priority: Medium  . Hypertension 04/21/2016    Priority: Medium  . Diabetic neuropathy ([place]) 04/21/2016    Priority: Medium  . Former smoker 04/21/2016    Priority: Low  . Erectile dysfunction 04/21/2016    Priority: Low  . Right foot ulcer, limited to breakdown of skin ([place]) 01/24/2017    Medications- reviewed and updated Current Outpatient Medications  Medication Sig Dispense Refill  . azithromycin (ZITHROMAX) 250 MG tablet Take 2 tabs on day 1, then 1 tab daily until finished 6 tablet 0  . BD PEN NEEDLE NANO U/F 32G X 4 MM MISC USE 1 PEN NEEDLE 5 TIMES A DAY 200 each 4  . glucose blood test strip Use to test blood sugar three times a day & PRN E11.9 One Touch Ultra 100 each 12  . Insulin Degludec (TRESIBA FLEXTOUCH) 200 UNIT/ML SOPN Inject 70-80 Units into the skin 2 (two) times daily. 9 pen 5  . insulin lispro (HUMALOG KWIKPEN) 100 UNIT/ML KiwkPen INJECT 20 UNITS INTO THE SKIN THREE TIMES A DAY 15 pen 1  . metFORMIN (GLUCOPHAGE) 1000 MG tablet TAKE 1 TABLET BY MOUTH 2 TIMES DAILY WITH A MEAL 60 tablet 5  . Omega-3 Fatty Acids (FISH OIL) 1200 MG CPDR Take 1 capsule by mouth daily.    . ramipril (ALTACE) 10 MG capsule TAKE 1 CAPSULE BY MOUTH 2 TIMES DAILY 60 capsule 5  . rosuvastatin (CRESTOR) 10 MG tablet TAKE 1 TABLET BY MOUTH DAILY 30 tablet 5  . sildenafil (VIAGRA) 100 MG tablet Take 1 tablet (100 mg total) by mouth daily as needed for  erectile dysfunction. 90 tablet 0  . VICTOZA 18 MG/3ML SOPN DIAL AND INJECT 1.8MG  UNDER THE SKIN DAILY 9 pen 3  . ZINC OXIDE PO Take 1 tablet by mouth daily.     No current facility-administered medications for this visit.     Objective: BP 132/88 (BP Location: Left Arm, Patient Position: Sitting, Cuff Size: Normal)   Pulse 97   Temp 97.9 F (36.6 C) (Oral)   Ht 6\' 4"  (1.93 m)   Wt 258 lb 12.8 oz (117.4 kg)   SpO2 97%   BMI 31.50 kg/m  Gen: NAD, resting comfortably Both nares filled with green, yellow discharge and some dried blood- cannot get good view of turbinates. Pharynx with some drainage.  CV: RRR no murmurs rubs or gallops Lungs: CTAB no crackles, wheeze, rhonchi Abdomen: soft/nontender/nondistended/normal bowel sounds. No rebound or guarding.  Ext: no edema Skin: warm, dry Neuro: grossly normal, moves all extremities  Diabetic Foot Exam - Simple   Simple Foot Form Diabetic Foot exam was performed with the following findings:  Yes 07/10/2017  4:38 PM  Visual Inspection See comments:  Yes Sensation Testing See comments:  Yes Pulse Check Posterior Tibialis and Dorsalis pulse intact bilaterally:  Yes Comments BKA left. 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 On 4th toe dorsal aspect very small scab which is  healing per patient. Callous at head of metatarsals not new- some slight dried blood noted underneath.     Assessment/Plan:  Sinus congestion S:  Had cold 3-4 weeks ago then most of that went away but still having sinus congestion. Yellow/green mucus. Denies pressure. Gets some dry mouth. Hard to breath through nose. hasnt tried allergy meds.  A/P: Please try zyrtec nightly for next 10 days. If not making any improvement or symptoms worsen I will call in augmentin antibiotic- this very well may be a lingering bacterial infection but lets calm down any allergic portion first  Type 2 diabetes mellitus with neurological complications ([place]) S:  controlled. On tresiba 70  units BID, metformin 1 g BID, victoza 1.8 mg daily. Also does lispro 20 units 3x a day.  Left BKA related to diabetes from 2013.  CBGs- one low blood sugar- doubled up on humalog on accident- so no low blood sugars on right dose. Morning sugars around 175 Lab Results  Component Value Date   HGBA1C 7.7 07/10/2017   HGBA1C 7.3 (H) 03/10/2017   HGBA1C 6.6 (H) 11/11/2016   A/P: ends up running short 1-2 days a month with his tresiba and a1c sliding up slightly. We will increase dose on label to try to help with this plus if still elevated at next visit may go up slightly.   Hyperlipidemia S: well controlled on crestor 10mg  and fish oil with Last ldl may 2018 at 47.  A/P: update lipids next visit  Hypertension S: controlled on ramipril 10mg  BID BP Readings from Last 3 Encounters:  07/10/17 132/88  03/10/17 102/78  11/11/16 118/84  A/P: We discussed blood pressure goal of <140/90. Continue current meds  Right foot ulcer, limited to breakdown of skin ([place]) Apparently was almost fully healed then he pulled scab and rebled- he is waiting longer to pull this time. I advised him to see Dr. Sharol Given back if with next removal not resolved especially with history of BKA left and right 5th toe amputation.    Return in about 3 months (around 10/16/2017) for physical, come fasting for visit.  Lab/Order associations: Type 2 diabetes mellitus with neurological complications ([place]) - Plan: POCT glycosylated hemoglobin (Hb A1C)  Meds ordered this encounter  Medications  . DISCONTD: Insulin Degludec (TRESIBA FLEXTOUCH) 200 UNIT/ML SOPN    Sig: Inject 70 Units into the skin 2 (two) times daily.    Dispense:  15 pen    Refill:  1  . insulin lispro (HUMALOG KWIKPEN) 100 UNIT/ML KiwkPen    Sig: INJECT 20 UNITS INTO THE SKIN THREE TIMES A DAY    Dispense:  15 pen    Refill:  1  . Insulin Degludec (TRESIBA FLEXTOUCH) 200 UNIT/ML SOPN    Sig: Inject 70-80 Units into the skin 2 (two) times daily.    Dispense:   9 pen    Refill:  5   Return precautions advised.  Garret Reddish, MD

## 2017-07-10 NOTE — Patient Instructions (Addendum)
Health Maintenance Due  Topic Date Due  . FOOT EXAM - Today at Office Visit 04/21/2017   Please try zyrtec nightly for next 10 days. If not making any improvement or symptoms worsen I will call in augmentin antibiotic- this very well may be a lingering bacterial infection but lets calm down any allergic portion first  No changes in medicine other than increasing # of pens so you dont run out

## 2017-07-10 NOTE — Assessment & Plan Note (Signed)
S: well controlled on crestor 10mg  and fish oil with Last ldl may 2018 at 47.  A/P: update lipids next visit

## 2017-07-10 NOTE — Assessment & Plan Note (Signed)
S:  controlled. On tresiba 70 units BID, metformin 1 g BID, victoza 1.8 mg daily. Also does lispro 20 units 3x a day.  Left BKA related to diabetes from 2013.  CBGs- one low blood sugar- doubled up on humalog on accident- so no low blood sugars on right dose. Morning sugars around 175 Lab Results  Component Value Date   HGBA1C 7.7 07/10/2017   HGBA1C 7.3 (H) 03/10/2017   HGBA1C 6.6 (H) 11/11/2016   A/P: ends up running short 1-2 days a month and a1c sliding up slightly. We will increase dose on label to try to help with this plus if still elevated at next visit may go up slightly.

## 2017-07-10 NOTE — Assessment & Plan Note (Signed)
Apparently was almost fully healed then he pulled scab and rebled- he is waiting longer to pull this time. I advised him to see Dr. Sharol Given back if with next removal not resolved especially with history of BKA left and right 5th toe amputation.

## 2017-07-14 ENCOUNTER — Encounter: Payer: Self-pay | Admitting: Family Medicine

## 2017-08-16 ENCOUNTER — Encounter: Payer: Self-pay | Admitting: Family Medicine

## 2017-08-18 ENCOUNTER — Other Ambulatory Visit: Payer: Self-pay

## 2017-08-18 MED ORDER — METFORMIN HCL ER 500 MG PO TB24: 1000.0000 mg | ORAL_TABLET | Freq: Two times a day (BID) | ORAL | 1 refills | Status: DC

## 2017-08-18 MED ORDER — VARDENAFIL HCL 10 MG PO TABS: 10.0000 mg | ORAL_TABLET | Freq: Every day | ORAL | 0 refills | Status: DC | PRN

## 2017-09-13 ENCOUNTER — Other Ambulatory Visit: Payer: Self-pay

## 2017-09-13 MED ORDER — LIRAGLUTIDE 18 MG/3ML ~~LOC~~ SOPN: PEN_INJECTOR | SUBCUTANEOUS | 3 refills | Status: DC

## 2017-10-03 LAB — HM DIABETES EYE EXAM

## 2017-10-09 ENCOUNTER — Encounter: Payer: Self-pay | Admitting: Family Medicine

## 2017-10-27 ENCOUNTER — Encounter: Payer: 59 | Admitting: Family Medicine

## 2017-12-21 ENCOUNTER — Ambulatory Visit (INDEPENDENT_AMBULATORY_CARE_PROVIDER_SITE_OTHER): Payer: 59 | Admitting: Orthopedic Surgery

## 2017-12-22 ENCOUNTER — Other Ambulatory Visit: Payer: Self-pay

## 2017-12-22 DIAGNOSIS — E1149 Type 2 diabetes mellitus with other diabetic neurological complication: Secondary | ICD-10-CM

## 2017-12-22 MED ORDER — INSULIN ASPART 100 UNIT/ML ~~LOC~~ SOLN: SUBCUTANEOUS | 3 refills | Status: DC

## 2017-12-25 ENCOUNTER — Ambulatory Visit (INDEPENDENT_AMBULATORY_CARE_PROVIDER_SITE_OTHER): Payer: 59 | Admitting: Orthopedic Surgery

## 2017-12-29 ENCOUNTER — Other Ambulatory Visit: Payer: Self-pay | Admitting: Family Medicine

## 2018-01-03 ENCOUNTER — Other Ambulatory Visit: Payer: Self-pay

## 2018-01-03 ENCOUNTER — Telehealth: Payer: Self-pay | Admitting: *Deleted

## 2018-01-03 MED ORDER — GLUCOSE BLOOD VI STRP: ORAL_STRIP | 11 refills | Status: AC

## 2018-01-03 MED ORDER — INSULIN PEN NEEDLE 32G X 4 MM MISC: 4 refills | Status: DC

## 2018-01-03 NOTE — Telephone Encounter (Signed)
Prescriptions sent as requested to pharmacy

## 2018-01-03 NOTE — Telephone Encounter (Signed)
Can you send a prescription for Blood Glucose Test Strip Miscellaneous Strip and pen needles to:    [facility]  [place]  [phone]   Per scheduling request message.

## 2018-01-04 ENCOUNTER — Ambulatory Visit (INDEPENDENT_AMBULATORY_CARE_PROVIDER_SITE_OTHER): Payer: BLUE CROSS/BLUE SHIELD

## 2018-01-04 ENCOUNTER — Encounter: Payer: Self-pay | Admitting: Family Medicine

## 2018-01-04 DIAGNOSIS — Z23 Encounter for immunization: Secondary | ICD-10-CM | POA: Diagnosis not present

## 2018-01-11 ENCOUNTER — Other Ambulatory Visit: Payer: Self-pay | Admitting: Family Medicine

## 2018-01-16 ENCOUNTER — Other Ambulatory Visit: Payer: Self-pay | Admitting: *Deleted

## 2018-01-16 MED ORDER — METFORMIN HCL 1000 MG PO TABS: 1000.0000 mg | ORAL_TABLET | Freq: Two times a day (BID) | ORAL | 1 refills | Status: DC

## 2018-01-16 NOTE — Telephone Encounter (Signed)
Received request for Metformin 1000 mg tab. New Rx sent. See meds. Pt informed.

## 2018-01-23 ENCOUNTER — Telehealth: Payer: Self-pay

## 2018-01-23 NOTE — Telephone Encounter (Signed)
Ok- which long acting insulins do they cover?

## 2018-01-23 NOTE — Telephone Encounter (Signed)
PA initiated via [email or web address]  PA denied. PA criteria does not allow coverage of Tyler Aas if the participant has not demonstrated a failure of, or intolerance to, a 6 week tried of a majority (2 or more in a class with at least 2 alternatives or 1 in a class with only 1 alternative) of the preferred formulary/preferred drug list alternatives for the given diagnosis.

## 2018-01-24 NOTE — Addendum Note (Signed)
Addended by: Jasper Loser on: 01/24/2018 03:42 PM   Modules accepted: Orders

## 2018-01-24 NOTE — Telephone Encounter (Signed)
Luis Lane, please double checking pending rx order for Toujeo before sending. Thanks.

## 2018-01-24 NOTE — Telephone Encounter (Signed)
May send in [place] with same dosage instructions as Tyler Aas

## 2018-01-24 NOTE — Telephone Encounter (Signed)
[employer] and was advised that preferred alternatives to [place] are Lantus and Toujeo.

## 2018-01-25 MED ORDER — INSULIN GLARGINE (1 UNIT DIAL) 300 UNIT/ML ~~LOC~~ SOPN: 70.0000 [IU] | PEN_INJECTOR | Freq: Two times a day (BID) | SUBCUTANEOUS | 5 refills | Status: DC

## 2018-01-25 NOTE — Telephone Encounter (Signed)
Called pt and advised of medication change. Rx sent to [facility]. Warning reviewed by Dr. Yong Channel. No further action required at this time.

## 2018-01-25 NOTE — Addendum Note (Signed)
Addended by: Jasper Loser on: 01/25/2018 09:16 AM   Modules accepted: Orders

## 2018-03-12 ENCOUNTER — Other Ambulatory Visit: Payer: Self-pay | Admitting: Family Medicine

## 2018-03-15 ENCOUNTER — Other Ambulatory Visit: Payer: Self-pay | Admitting: Family Medicine

## 2018-04-03 ENCOUNTER — Encounter: Payer: Self-pay | Admitting: Family Medicine

## 2018-04-05 ENCOUNTER — Encounter: Payer: Self-pay | Admitting: Family Medicine

## 2018-06-01 ENCOUNTER — Ambulatory Visit (INDEPENDENT_AMBULATORY_CARE_PROVIDER_SITE_OTHER): Payer: BLUE CROSS/BLUE SHIELD | Admitting: Family Medicine

## 2018-06-01 ENCOUNTER — Encounter: Payer: Self-pay | Admitting: Family Medicine

## 2018-06-01 ENCOUNTER — Other Ambulatory Visit: Payer: Self-pay

## 2018-06-01 VITALS — BP 136/78 | HR 68 | Temp 97.5°F | Ht 76.0 in | Wt 216.2 lb

## 2018-06-01 DIAGNOSIS — E1149 Type 2 diabetes mellitus with other diabetic neurological complication: Secondary | ICD-10-CM | POA: Diagnosis not present

## 2018-06-01 DIAGNOSIS — Z1211 Encounter for screening for malignant neoplasm of colon: Secondary | ICD-10-CM

## 2018-06-01 DIAGNOSIS — Z0000 Encounter for general adult medical examination without abnormal findings: Secondary | ICD-10-CM

## 2018-06-01 DIAGNOSIS — E1142 Type 2 diabetes mellitus with diabetic polyneuropathy: Secondary | ICD-10-CM

## 2018-06-01 DIAGNOSIS — Z79899 Other long term (current) drug therapy: Secondary | ICD-10-CM

## 2018-06-01 DIAGNOSIS — Z6826 Body mass index (BMI) 26.0-26.9, adult: Secondary | ICD-10-CM

## 2018-06-01 DIAGNOSIS — E785 Hyperlipidemia, unspecified: Secondary | ICD-10-CM

## 2018-06-01 DIAGNOSIS — Z89512 Acquired absence of left leg below knee: Secondary | ICD-10-CM

## 2018-06-01 DIAGNOSIS — I10 Essential (primary) hypertension: Secondary | ICD-10-CM

## 2018-06-01 DIAGNOSIS — Z87891 Personal history of nicotine dependence: Secondary | ICD-10-CM

## 2018-06-01 LAB — URINALYSIS
Bilirubin Urine: NEGATIVE
Hgb urine dipstick: NEGATIVE
Leukocytes,Ua: NEGATIVE
Nitrite: NEGATIVE
Specific Gravity, Urine: 1.025 (ref 1.000–1.030)
TOTAL PROTEIN, URINE-UPE24: NEGATIVE
Urine Glucose: >=1000 — AB
Urobilinogen, UA: 0.2 (ref 0.0–1.0)
pH: 5.5 (ref 5.0–8.0)

## 2018-06-01 LAB — HEMOGLOBIN A1C: Hgb A1c MFr Bld: 10.6 % — ABNORMAL HIGH (ref 4.6–6.5)

## 2018-06-01 LAB — COMPREHENSIVE METABOLIC PANEL
ALK PHOS: 64 U/L (ref 39–117)
ALT: 20 U/L (ref 0–53)
AST: 21 U/L (ref 0–37)
Albumin: 4.8 g/dL (ref 3.5–5.2)
BUN: 13 mg/dL (ref 6–23)
CO2: 26 meq/L (ref 19–32)
Calcium: 9.6 mg/dL (ref 8.4–10.5)
Chloride: 102 mEq/L (ref 96–112)
Creatinine, Ser: 0.86 mg/dL (ref 0.40–1.50)
GFR: 94.01 mL/min (ref >60.00)
GLUCOSE: 150 mg/dL — AB (ref 70–99)
POTASSIUM: 3.8 meq/L (ref 3.5–5.1)
SODIUM: 138 meq/L (ref 135–145)
TOTAL PROTEIN: 7 g/dL (ref 6.0–8.3)
Total Bilirubin: 0.9 mg/dL (ref 0.2–1.2)

## 2018-06-01 LAB — CBC
HEMATOCRIT: 43.9 % (ref 39.0–52.0)
Hemoglobin: 15.3 g/dL (ref 13.0–17.0)
MCHC: 34.9 g/dL (ref 30.0–36.0)
MCV: 88.3 fl (ref 78.0–100.0)
Platelets: 168 10*3/uL (ref 150.0–400.0)
RBC: 4.97 Mil/uL (ref 4.22–5.81)
RDW: 13.7 % (ref 11.5–15.5)
WBC: 5.8 10*3/uL (ref 4.0–10.5)

## 2018-06-01 LAB — LIPID PANEL
CHOL/HDL RATIO: 4
Cholesterol: 136 mg/dL (ref 0–200)
HDL: 36.5 mg/dL — AB (ref >39.00)
Triglycerides: 469 mg/dL — ABNORMAL HIGH (ref 0.0–149.0)

## 2018-06-01 LAB — LDL CHOLESTEROL, DIRECT: Direct LDL: 40 mg/dL

## 2018-06-01 LAB — VITAMIN B12: Vitamin B-12: 427 pg/mL (ref 211–911)

## 2018-06-01 MED ORDER — SILDENAFIL CITRATE 100 MG PO TABS: 100.0000 mg | ORAL_TABLET | Freq: Every day | ORAL | 0 refills | Status: DC | PRN

## 2018-06-01 NOTE — Patient Instructions (Signed)
Health Maintenance Due  Topic Date Due  . HEMOGLOBIN A1C - today 01/09/2018  . COLONOSCOPY - We will call you within two weeks about your referral to GI. If you do not hear within 3 weeks, give [place] a call.   02/04/2018    Please stop by lab before you go If you do not have mychart- we will call you about results within 5 business days of [place] receiving them.  If you have mychart- we will send your results within 3 business days of [place] receiving them.  If abnormal or we want to clarify a result, we will call or mychart you to make sure you receive the message.  If you have questions or concerns or don't hear within 5-7 days, please send [place] a message or call us.

## 2018-06-01 NOTE — Progress Notes (Signed)
Phone: [phone]   Subjective:  Patient presents today for their annual physical. Chief complaint-noted.   See problem oriented charting- ROS- full  review of systems was completed and negative except for: visual problems- wears glasses  BMI monitoring- elevated BMI noted: Body mass index is 26.32 kg/m. Encouraged need for healthy eating, regular exercise, weight loss.   BMI Metric Follow Up - 06/01/18 1017      BMI Metric Follow Up-Please document annually   BMI Metric Follow Up  Education provided      The following were reviewed and entered/updated in epic: Past Medical History:  Diagnosis Date  . Chicken pox   . Diabetes mellitus type II, controlled ([place]) 04/21/2016   Left BKA 2013. Last a1c in November was 6.2. [facility] 07/2014. victoza 1.8, metformin 1g BID, lantus 70 BID--> tresiba due to insurance. Humalog 20 units 3x a day. Exercise helps. Nutritionist desired.   Marland Kitchen Hx of BKA, left ([place]) 04/21/2016   2013 duda  . Hyperlipidemia 04/21/2016   crestor 10mg , fish oil. Allergic to aspirin even 81mg - hives.   . Hypertension 04/21/2016   Ramipril 10mg  BID   Patient Active Problem List   Diagnosis Date Noted  . Type 2 diabetes mellitus with neurological complications ([place]) 31/54/0086    Priority: High  . Hyperlipidemia 04/21/2016    Priority: Medium  . S/P BKA (below knee amputation) unilateral, left ([place]) 04/21/2016    Priority: Medium  . Hypertension 04/21/2016    Priority: Medium  . Diabetic neuropathy ([place]) 04/21/2016    Priority: Medium  . Former smoker 04/21/2016    Priority: Low  . Erectile dysfunction 04/21/2016    Priority: Low  . Right foot ulcer, limited to breakdown of skin ([place]) 01/24/2017   Past Surgical History:  Procedure Laterality Date  . AMPUTATION     3rd toe right  . APPENDECTOMY     1974  . Left Leg Amputee Left 2013    Family History  Problem Relation Age of Onset  . Diabetes Mother        pt at [facility]  . Ovarian cancer Mother    . Hypertension Mother   . Diabetes Father   . Diabetes Brother   . Diabetic kidney disease Brother         and retinopathy  . Alcohol abuse Maternal Grandmother        and some uncles. moms side  . Stroke Paternal Grandmother        smoker as well  . Heart attack Paternal Grandmother   . Diabetes Paternal Grandmother     Medications- reviewed and updated Current Outpatient Medications  Medication Sig Dispense Refill  . glucose blood (ONE TOUCH ULTRA TEST) test strip USE TO TEST BLOOD SUGAR THREE TIMES A DAY AND AS NEEDED 100 each 11  . Insulin Pen Needle (BD PEN NEEDLE NANO U/F) 32G X 4 MM MISC USE 1 PEN NEEDLE 5 TIMES A DAY 200 each 4  . Omega-3 Fatty Acids (FISH OIL) 1200 MG CPDR Take 1 capsule by mouth daily.    . ramipril (ALTACE) 10 MG capsule TAKE 1 CAPSULE BY MOUTH 2 TIMES DAILY 60 capsule 5  . rosuvastatin (CRESTOR) 10 MG tablet TAKE 1 TABLET BY MOUTH DAILY 30 tablet 5  . ZINC OXIDE PO Take 1 tablet by mouth daily.    . sildenafil (VIAGRA) 100 MG tablet Take 1 tablet (100 mg total) by mouth daily as needed for erectile dysfunction. 90 tablet 0   No  current facility-administered medications for this visit.     Allergies-reviewed and updated Allergies  Allergen Reactions  . Asa [Aspirin] Hives and Shortness Of Breath  . Morphine And Related Hives and Shortness Of Breath    Social History   Social History Narrative   Divorced. Lives with parents. 3 children. Oldest son 50- in [place] in school. Kids lives in [place].    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 Oropharynx normal Neck: no thyromegaly CV: RRR no murmurs rubs or gallops Lungs: CTAB no crackles, wheeze, rhonchi Abdomen: soft/nontender/nondistended/normal bowel sounds. No rebound or guarding.  Ext: no edema on right, left BKA noted Skin: warm, dry Neuro: grossly normal, moves all extremities, PERRLA   Assessment and Plan  53 y.o. male presenting for annual physical.  Health Maintenance counseling: 1. Anticipatory guidance: Patient counseled regarding regular dental exams -q6 months, eye exams -yearly,  avoiding smoking and second hand smoke , limiting alcohol to 2 beverages per day - 2 or less per month.   2. Risk factor reduction:  Advised patient of need for regular exercise and diet rich and fruits and vegetables to reduce risk of heart attack and stroke. Exercise-  Planning to go back to daily but right now doing 3 days a week. Diet- The patient has dropped dramatic weight over the last year- see diabetes discussion below Wt Readings from Last 3 Encounters:  06/01/18 216 lb 4 oz (98.1 kg)  07/10/17 258 lb 12.8 oz (117.4 kg)  03/10/17 248 lb 12.8 oz (112.9 kg)  3. Immunizations/screenings/ancillary studies- shingrix next year in light of current covid 19 issues Immunization History  Administered Date(s) Administered  . Influenza,inj,Quad PF,6+ Mos 11/11/2016, 01/04/2018  . Influenza-Unspecified 02/03/2016  . Pneumococcal Polysaccharide-23 04/21/2016  . Tdap 03/10/2017  4. Prostate cancer screening-  No family history, start at age 35   5. Colon cancer screening -referral for colonoscopy placed today  6. Skin cancer screening- no dermatologist. advised regular sunscreen use. Denies worrisome, changing, or new skin lesions- has one stable 3 mm dark spot on mid abdomen that we will monitor- if gets larger- punch biopsy or refer to derm 7.  Former smoker-15 pack years but quit in 2012.  Will get  UA to make sure no hematuria  Status of chronic or acute concerns   #Diabetes/overweight (much improved) S: Last year patient was on Tresiba 70 units twice a day, metformin 1 g twice daily, Victoza.  Was also taking lispro 20 units 3 times a day.  Left BKA related to diabetes from 2013  DRASTIC weight loss!  He has been eating a lot of salads and working out a lot. Really cut down on carbs.   Currently on metformin alone. Off all insulin and victoza (was expensive Wants to consider pills such as glyburide  Morning sugars are 250 or so A/P:  I suspect elevated  a1c and im concerned if it remains poorly controlled with weight loss that he may need insulin again. If under 10- perhaps could get there with glyburide and jardiance - would consider relion insulin due to costs of all insulin being high on his plan  #Hyperlipidemia-remains on Crestor and fish oil.  Update lipid panel  #Hypertension-remains controlled on Amaryl 10 mg twice a day  # ED- has tried levitra at times and viagra at times- he prefers to try viagra at present  Return in about 6 months (around 12/02/2018) for follow up- or sooner if needed.  Lab/Order associations:fasting  Preventative health care - Plan: CBC, Comprehensive metabolic panel, Hemoglobin A1c, Lipid panel, Urinalysis, Ambulatory referral to Gastroenterology, sildenafil (VIAGRA) 100 MG tablet, Vitamin B12, Urinalysis  Type 2 diabetes mellitus with neurological complications ([facility]) - Plan: CBC, Comprehensive metabolic panel, Hemoglobin A1c, Lipid panel, Urinalysis, Urinalysis  Hyperlipidemia, unspecified hyperlipidemia type  Essential hypertension  S/P BKA (below knee amputation) unilateral, left ([facility])  Diabetic polyneuropathy associated with type 2 diabetes mellitus ([place])  Former smoker  BMI 26.0-26.9,adult  High risk medication use - Plan: Vitamin B12  Screen for colon cancer - Plan: Ambulatory referral to Gastroenterology  Meds ordered this  encounter  Medications  . sildenafil (VIAGRA) 100 MG tablet    Sig: Take 1 tablet (100 mg total) by mouth daily as needed for erectile dysfunction.    Dispense:  90 tablet    Refill:  0   Return precautions advised.  Garret Reddish, MD

## 2018-06-02 ENCOUNTER — Other Ambulatory Visit: Payer: Self-pay | Admitting: Family Medicine

## 2018-06-02 MED ORDER — INSULIN NPH ISOPHANE & REGULAR (70-30) 100 UNIT/ML ~~LOC~~ SUSP: SUBCUTANEOUS | 11 refills | Status: DC

## 2018-06-02 MED ORDER — "INSULIN SYRINGE-NEEDLE U-100 31G X 15/64"" 0.3 ML MISC": 1.0000 | Freq: Two times a day (BID) | 11 refills | Status: DC

## 2018-06-02 MED ORDER — "INSULIN SYRINGE-NEEDLE U-100 31G X 15/64"" 0.3 ML MISC": 1.0000 | Freq: Two times a day (BID) | 11 refills | Status: AC

## 2018-06-02 NOTE — Addendum Note (Signed)
Addended by: Marin Olp on: 06/02/2018 11:06 AM   Modules accepted: Orders

## 2018-06-28 ENCOUNTER — Other Ambulatory Visit: Payer: Self-pay | Admitting: Family Medicine

## 2018-07-20 ENCOUNTER — Other Ambulatory Visit: Payer: Self-pay | Admitting: Family Medicine

## 2018-08-04 ENCOUNTER — Other Ambulatory Visit: Payer: Self-pay | Admitting: Family Medicine

## 2018-08-27 ENCOUNTER — Other Ambulatory Visit: Payer: Self-pay | Admitting: Family Medicine

## 2018-08-30 ENCOUNTER — Other Ambulatory Visit: Payer: Self-pay | Admitting: Family Medicine

## 2018-09-25 ENCOUNTER — Encounter: Payer: Self-pay | Admitting: Gastroenterology

## 2018-09-28 ENCOUNTER — Other Ambulatory Visit: Payer: Self-pay | Admitting: Family Medicine

## 2018-09-30 ENCOUNTER — Other Ambulatory Visit: Payer: Self-pay | Admitting: Family Medicine

## 2018-10-01 ENCOUNTER — Other Ambulatory Visit: Payer: Self-pay | Admitting: *Deleted

## 2018-10-15 ENCOUNTER — Ambulatory Visit (AMBULATORY_SURGERY_CENTER): Payer: Self-pay

## 2018-10-15 ENCOUNTER — Other Ambulatory Visit: Payer: Self-pay

## 2018-10-15 VITALS — Ht 76.0 in | Wt 220.0 lb

## 2018-10-15 DIAGNOSIS — Z1211 Encounter for screening for malignant neoplasm of colon: Secondary | ICD-10-CM

## 2018-10-15 MED ORDER — NA SULFATE-K SULFATE-MG SULF 17.5-3.13-1.6 GM/177ML PO SOLN: 1.0000 | Freq: Once | ORAL | 0 refills | Status: AC

## 2018-10-15 NOTE — Progress Notes (Signed)
Denies allergies to eggs or soy products. Denies complication of anesthesia or sedation. Denies use of weight loss medication. Denies use of O2.   Emmi instructions given for colonoscopy.  Pre-Visit was conducted by phone due to Covid 19. Instructions were reviewed and mailed to patients confirmed home address. A 15.00 coupon for Suprep was given to the patient. Patient was encouraged to call if he had any questions regarding instructions.  

## 2018-10-26 ENCOUNTER — Telehealth: Payer: Self-pay | Admitting: Gastroenterology

## 2018-10-26 NOTE — Telephone Encounter (Signed)
Left messge on vmail to call back regarding Covid-19 screening questions. Covid-19 Screening Questions:   Do you now or have you had a fever in the last 14 days?    Do you have any respiratory symptoms of shortness of breath or cough now or in the last 14 days?    Do you have any family members or close contacts with diagnosed  or suspected Covid-19 in the past 14 days?    Have you been tested for Covid-19 and found to be positive?

## 2018-10-26 NOTE — Telephone Encounter (Signed)
Pt responded "no" to all screening questions °

## 2018-10-29 ENCOUNTER — Encounter: Payer: Self-pay | Admitting: Gastroenterology

## 2018-10-29 ENCOUNTER — Ambulatory Visit (AMBULATORY_SURGERY_CENTER): Payer: BC Managed Care – PPO | Admitting: Gastroenterology

## 2018-10-29 ENCOUNTER — Other Ambulatory Visit: Payer: Self-pay

## 2018-10-29 VITALS — BP 98/60 | HR 73 | Temp 98.6°F | Resp 10 | Ht 76.0 in | Wt 220.0 lb

## 2018-10-29 DIAGNOSIS — D120 Benign neoplasm of cecum: Secondary | ICD-10-CM | POA: Diagnosis not present

## 2018-10-29 DIAGNOSIS — Z1211 Encounter for screening for malignant neoplasm of colon: Secondary | ICD-10-CM

## 2018-10-29 DIAGNOSIS — D125 Benign neoplasm of sigmoid colon: Secondary | ICD-10-CM

## 2018-10-29 MED ORDER — SODIUM CHLORIDE 0.9 % IV SOLN: 500.0000 mL | Freq: Once | INTRAVENOUS | Status: AC

## 2018-10-29 NOTE — Progress Notes (Signed)
Temperature- June Bullock VS- Riki Sheer

## 2018-10-29 NOTE — Progress Notes (Signed)
Pt's states no medical or surgical changes since previsit or office visit.  June Bullock took temp and Nancy Campbell took vitals. 

## 2018-10-29 NOTE — Patient Instructions (Signed)
YOU HAD AN ENDOSCOPIC PROCEDURE TODAY AT [facility]:   Refer to the procedure report that was given to you for any specific questions about what was found during the examination.  If the procedure report does not answer your questions, please call your gastroenterologist to clarify.  If you requested that your care partner not be given the details of your procedure findings, then the procedure report has been included in a sealed envelope for you to review at your convenience later.  YOU SHOULD EXPECT: Some feelings of bloating in the abdomen. Passage of more gas than usual.  Walking can help get rid of the air that was put into your GI tract during the procedure and reduce the bloating. If you had a lower endoscopy (such as a colonoscopy or flexible sigmoidoscopy) you may notice spotting of blood in your stool or on the toilet paper. If you underwent a bowel prep for your procedure, you may not have a normal bowel movement for a few days.  Please Note:  You might notice some irritation and congestion in your nose or some drainage.  This is from the oxygen used during your procedure.  There is no need for concern and it should clear up in a day or so.  SYMPTOMS TO REPORT IMMEDIATELY:   Following lower endoscopy (colonoscopy or flexible sigmoidoscopy):  Excessive amounts of blood in the stool  Significant tenderness or worsening of abdominal pains  Swelling of the abdomen that is new, acute  Fever of 100F or higher  Please see handouts given to you on Polyps and Hemorrhoids.  For urgent or emergent issues, a gastroenterologist can be reached at any hour by calling [phone].   DIET:  We do recommend a small meal at first, but then you may proceed to your regular diet.  Drink plenty of fluids but you should avoid alcoholic beverages for 24 hours.  ACTIVITY:  You should plan to take it easy for the rest of today and you should NOT DRIVE or use heavy machinery until tomorrow  (because of the sedation medicines used during the test).    FOLLOW UP: Our staff will call the number listed on your records 48-72 hours following your procedure to check on you and address any questions or concerns that you may have regarding the information given to you following your procedure. If we do not reach you, we will leave a message.  We will attempt to reach you two times.  During this call, we will ask if you have developed any symptoms of COVID 19. If you develop any symptoms (ie: fever, flu-like symptoms, shortness of breath, cough etc.) before then, please call [phone].  If you test positive for Covid 19 in the 2 weeks post procedure, please call and report this information to [place].    If any biopsies were taken you will be contacted by phone or by letter within the next 1-3 weeks.  Please call us at [phone] if you have not heard about the biopsies in 3 weeks.    SIGNATURES/CONFIDENTIALITY: You and/or your care partner have signed paperwork which will be entered into your electronic medical record.  These signatures attest to the fact that that the information above on your After Visit Summary has been reviewed and is understood.  Full responsibility of the confidentiality of this discharge information lies with you and/or your care-partner.  Thank you for letting us take care of your healthcare needs today.

## 2018-10-29 NOTE — Progress Notes (Signed)
Called to room to assist during endoscopic procedure.  Patient ID and intended procedure confirmed with present staff. Received instructions for my participation in the procedure from the performing physician.  

## 2018-10-29 NOTE — Op Note (Signed)
[facility] Patient Name: Luis Lane Procedure Date: 10/29/2018 12:56 PM MRN: [id number] Endoscopist: Thornton Park MD, MD Age: 53 Referring MD:  Date of Birth: Oct 30, 1965 Gender: Male Account #: [id number] Procedure:                Colonoscopy Indications:              Screening for colorectal malignant neoplasm, This                            is the patient's first colonoscopy                           No known family history of colon cancer or polyps                           No baseline GI symptoms Medicines:                See the Anesthesia note for documentation of the                            administered medications Procedure:                Pre-Anesthesia Assessment:                           - Prior to the procedure, a History and Physical                            was performed, and patient medications and                            allergies were reviewed. The patient's tolerance of                            previous anesthesia was also reviewed. The risks                            and benefits of the procedure and the sedation                            options and risks were discussed with the patient.                            All questions were answered, and informed consent                            was obtained. Prior Anticoagulants: The patient has                            taken no previous anticoagulant or antiplatelet                            agents. ASA Grade Assessment: II - A patient with  mild systemic disease. After reviewing the risks                            and benefits, the patient was deemed in                            satisfactory condition to undergo the procedure.                           After obtaining informed consent, the colonoscope                            was passed under direct vision. Throughout the                            procedure, the patient's blood pressure, pulse, and                         oxygen saturations were monitored continuously. The                            Colonoscope was introduced through the anus and                            advanced to the the terminal ileum, with                            identification of the appendiceal orifice and IC                            valve. A second forward view of the right colon was                            performed. The colonoscopy was performed with                            difficulty due to a redundant colon and significant                            looping. Successful completion of the procedure was                            aided by applying abdominal pressure. The patient                            tolerated the procedure well. The quality of the                            bowel preparation was good. The terminal ileum,                            ileocecal valve, appendiceal orifice, and rectum  were photographed. Scope In: 1:05:12 PM Scope Out: 1:24:40 PM Scope Withdrawal Time: 0 hours 13 minutes 46 seconds  Total Procedure Duration: 0 hours 19 minutes 28 seconds  Findings:                 The perianal and digital rectal examinations were                            normal.                           A less than 1 mm polyp was found in the cecum. The                            polyp was sessile. The polyp was removed with a                            piecemeal technique using a cold biopsy forceps.                            Resection and retrieval were complete. Estimated                            blood loss was minimal.                           A 3 mm polyp was found in the sigmoid colon. The                            polyp was sessile. The polyp was removed with a                            cold snare. Resection and retrieval were complete.                            Estimated blood loss was minimal.                           The exam was otherwise without  abnormality on                            direct and retroflexion views except for small                            internal hemorrhoids. Complications:            No immediate complications. Estimated blood loss:                            Minimal. Estimated Blood Loss:     Estimated blood loss was minimal. Impression:               - One less than 1 mm polyp in the cecum, removed  piecemeal using a cold biopsy forceps. Resected and                            retrieved.                           - One 3 mm polyp in the sigmoid colon, removed with                            a cold snare. Resected and retrieved.                           - The examination was otherwise normal on direct                            and retroflexion views. Recommendation:           - Patient has a contact number available for                            emergencies. The signs and symptoms of potential                            delayed complications were discussed with the                            patient. Return to normal activities tomorrow.                            Written discharge instructions were provided to the                            patient.                           - Resume previous diet today.                           - Continue present medications.                           - Await pathology results.                           - Repeat colonoscopy in 7 years for surveillance if                            at least one polyp is an adenoma. Thornton Park MD, MD 10/29/2018 1:32:02 PM This report has been signed electronically.

## 2018-10-29 NOTE — Progress Notes (Signed)
To PACU, VSS. Report to [email or web address] 

## 2018-10-30 ENCOUNTER — Other Ambulatory Visit: Payer: Self-pay | Admitting: Family Medicine

## 2018-10-31 ENCOUNTER — Telehealth: Payer: Self-pay

## 2018-10-31 NOTE — Telephone Encounter (Signed)
  Follow up Call-  Call back number 10/29/2018  Post procedure Call Back phone  # [phone]  Permission to leave phone message Yes  Some recent data might be hidden     Patient questions:  Do you have a fever, pain , or abdominal swelling? No. Pain Score  0 *  Have you tolerated food without any problems? Yes.    Have you been able to return to your normal activities? Yes.    Do you have any questions about your discharge instructions: Diet   No. Medications  No. Follow up visit  No.  Do you have questions or concerns about your Care? No.  Actions: * If pain score is 4 or above: No action needed, pain <4.  1. Have you developed a fever since your procedure? no  2.   Have you had an respiratory symptoms (SOB or cough) since your procedure? no  3.   Have you tested positive for COVID 19 since your procedure no  4.   Have you had any family members/close contacts diagnosed with the COVID 19 since your procedure?  no   If yes to any of these questions please route to Joylene John, RN and Alphonsa Gin, Therapist, sports.

## 2018-11-02 ENCOUNTER — Encounter: Payer: Self-pay | Admitting: Gastroenterology

## 2018-11-05 ENCOUNTER — Encounter: Payer: Self-pay | Admitting: Family Medicine

## 2018-11-05 DIAGNOSIS — Z86010 Personal history of colonic polyps: Secondary | ICD-10-CM | POA: Insufficient documentation

## 2018-11-27 ENCOUNTER — Other Ambulatory Visit: Payer: Self-pay | Admitting: Family Medicine

## 2018-11-30 ENCOUNTER — Other Ambulatory Visit: Payer: Self-pay | Admitting: Family Medicine

## 2018-12-03 ENCOUNTER — Encounter: Payer: Self-pay | Admitting: Family Medicine

## 2018-12-03 ENCOUNTER — Ambulatory Visit (INDEPENDENT_AMBULATORY_CARE_PROVIDER_SITE_OTHER): Payer: BC Managed Care – PPO | Admitting: Family Medicine

## 2018-12-03 VITALS — BP 129/82 | HR 82 | Ht 76.0 in | Wt 220.0 lb

## 2018-12-03 DIAGNOSIS — E1149 Type 2 diabetes mellitus with other diabetic neurological complication: Secondary | ICD-10-CM | POA: Diagnosis not present

## 2018-12-03 DIAGNOSIS — Z0000 Encounter for general adult medical examination without abnormal findings: Secondary | ICD-10-CM

## 2018-12-03 DIAGNOSIS — E1142 Type 2 diabetes mellitus with diabetic polyneuropathy: Secondary | ICD-10-CM

## 2018-12-03 DIAGNOSIS — I10 Essential (primary) hypertension: Secondary | ICD-10-CM

## 2018-12-03 DIAGNOSIS — E785 Hyperlipidemia, unspecified: Secondary | ICD-10-CM

## 2018-12-03 DIAGNOSIS — Z89512 Acquired absence of left leg below knee: Secondary | ICD-10-CM

## 2018-12-03 MED ORDER — ROSUVASTATIN CALCIUM 10 MG PO TABS: 10.0000 mg | ORAL_TABLET | Freq: Every day | ORAL | 3 refills | Status: DC

## 2018-12-03 MED ORDER — SILDENAFIL CITRATE 100 MG PO TABS: 100.0000 mg | ORAL_TABLET | Freq: Every day | ORAL | 0 refills | Status: AC | PRN

## 2018-12-03 MED ORDER — RAMIPRIL 10 MG PO CAPS: 10.0000 mg | ORAL_CAPSULE | Freq: Every day | ORAL | 3 refills | Status: DC

## 2018-12-03 MED ORDER — METFORMIN HCL 1000 MG PO TABS: 1000.0000 mg | ORAL_TABLET | Freq: Two times a day (BID) | ORAL | 3 refills | Status: DC

## 2018-12-03 NOTE — Patient Instructions (Addendum)
Health Maintenance Due  Topic Date Due  . FOOT EXAM  07/11/2018  . [facility], call to schedule 10/04/2018  . INFLUENZA VACCINE  10/20/2018  . HEMOGLOBIN A1C  12/02/2018    Depression screen PHQ 2/9 06/01/2018 11/11/2016  Decreased Interest 0 0  Down, Depressed, Hopeless 0 0  PHQ - 2 Score 0 0

## 2018-12-03 NOTE — Progress Notes (Signed)
Phone [phone]   Subjective:  Virtual visit via Video note. Chief complaint: Chief Complaint  Patient presents with  . Follow-up  . Diabetes  . Hypertension  . Hyperlipidemia  . Erectile Dysfunction    This visit type was conducted due to national recommendations for restrictions regarding the COVID-19 Pandemic (e.g. social distancing).  This format is felt to be most appropriate for this patient at this time balancing risks to patient and risks to population by having him in for in person visit.  No physical exam was performed (except for noted visual exam or audio findings with Telehealth visits).    Our team/I connected with Luis Lane at  8:40 AM EDT by a video enabled telemedicine application (doxy.me or caregility through epic) and verified that I am speaking with the correct person using two identifiers.  Location patient: Home-[place] Location provider: [facility], office Persons participating in the virtual visit:  patient  Our team/I discussed the limitations of evaluation and management by telemedicine and the availability of in person appointments. In light of current covid-19 pandemic, patient also understands that we are trying to protect them by minimizing in office contact if at all possible.  The patient expressed consent for telemedicine visit and agreed to proceed. Patient understands insurance will be billed.   ROS- Denies HA, dizziness, CP, SOB, visual changes.    Past Medical History-  Patient Active Problem List   Diagnosis Date Noted  . Type 2 diabetes mellitus with neurological complications ([place]) 99991111    Priority: High  . Hyperlipidemia 04/21/2016    Priority: Medium  . S/P BKA (below knee amputation) unilateral, left ([place]) 04/21/2016    Priority: Medium  . Hypertension 04/21/2016    Priority: Medium  . Diabetic neuropathy ([place]) 04/21/2016    Priority: Medium  . Former smoker 04/21/2016    Priority: Low  . Erectile dysfunction 04/21/2016   Priority: Low  . History of adenomatous polyp of colon 11/05/2018    Medications- reviewed and updated Current Outpatient Medications  Medication Sig Dispense Refill  . glucose blood (ONE TOUCH ULTRA TEST) test strip USE TO TEST BLOOD SUGAR THREE TIMES A DAY AND AS NEEDED 100 each 11  . insulin NPH-regular Human (70-30) 100 UNIT/ML injection Take 10-20 units before breakfast and 5-10 units before dinner 10 mL 11  . Insulin Syringe-Needle U-100 (RELION INSULIN SYRINGE) 31G X 15/64" 0.3 ML MISC 1 each by Does not apply route 2 (two) times daily. 100 each 11  . Omega-3 Fatty Acids (FISH OIL) 1200 MG CPDR Take 1 capsule by mouth daily.    . psyllium (METAMUCIL) 58.6 % powder Take 1 packet by mouth daily.    . ramipril (ALTACE) 10 MG capsule Take 1 capsule (10 mg total) by mouth daily. 90 capsule 3  . rosuvastatin (CRESTOR) 10 MG tablet Take 1 tablet (10 mg total) by mouth daily. 90 tablet 3  . sildenafil (VIAGRA) 100 MG tablet Take 1 tablet (100 mg total) by mouth daily as needed for erectile dysfunction. 90 tablet 0  . ZINC OXIDE PO Take 1 tablet by mouth daily.    . metFORMIN (GLUCOPHAGE) 1000 MG tablet Take 1 tablet (1,000 mg total) by mouth 2 (two) times daily with a meal. 180 tablet 3   Current Facility-Administered Medications  Medication Dose Route Frequency Provider Last Rate Last Dose  . 0.9 %  sodium chloride infusion  500 mL Intravenous Once [place], MD         Objective:  BP 129/82   Pulse 82   Ht 6\' 4"  (1.93 m)   Wt 220 lb (99.8 kg)   BMI 26.78 kg/m  self reported vitals Gen: NAD, resting comfortably Lungs: nonlabored, normal respiratory rate  Skin: appears dry, no obvious rash   Assessment and Plan   # Diabetes with neuropathy now status post left BKA S: At last visit poorly-controlled on Metformin 1000 mg BID.  At last visit we started Humulin 70/30 20 units qAM and 10 units qPM.  At last visit patient had lost significant weight and he thought he could come  off [place] and lispro- had also been on Victoza but this was too expensive (insulin and Victoza).  CBGs- Fasting BG in the 200's. After meals can get above 250 pretty regularly.  Denies hypoglycemic episodes. He is trying to work out more to bring this down.   Exercise and diet-  Has gained 5-10 lbs due to covid 19. Carbs and sweets in moderation, has been doing better over the past 2 weeks. Walking, biking, and/or strength training 5 days weekly.   Patient is status post BKA on the left.  Has decreased sensation on right calf.  Sees podiatry regularly and watch his feet.  No pain medicine needed at this time. Lab Results  Component Value Date   HGBA1C 10.6 (H) 06/01/2018   HGBA1C 7.7 07/10/2017   HGBA1C 7.3 (H) 03/10/2017   A/P: poor control- titrate ReliOn insulin 70/30 to 22 units in the morning and 11 units in the evening - Patient will try to give me some more blood sugars in 2 weeks-parenchyma data they are checked including before meals and 2 hours after meals -Continue metformin - Will come by for A1c in 2 weeks - Schedule a visit 3 months and 1 day after A1c -Status post left BKA stable -Neuropathy stable without medication  #hypertension S: controlled on Ramipril 10 mg once daily (down from twice a day as BP was running lower). Checking BP at home, staying around 120/80.  BP Readings from Last 3 Encounters:  12/03/18 129/82  10/29/18 98/60  06/01/18 136/78  A/P: Doing well on low-dose-continue current medication  #hyperlipidemia S:  controlled on Rosuvastatin 10 mg daily though triglycerides are somewhat high.  Lab Results  Component Value Date   CHOL 136 06/01/2018   HDL 36.50 (L) 06/01/2018   LDLCALC 47 08/02/2016   LDLDIRECT 40.0 06/01/2018   TRIG (H) 06/01/2018    469.0 Triglyceride is over 400; calculations on Lipids are invalid.   CHOLHDL 4 06/01/2018   A/P: Reasonably stable-plan to repeat triglycerides/lipids at physical-could bump statin dose if needed-would  prefer to get this under 250 if possible.  Triglycerides were likely high due to poor diabetes control  # Erectile Dysfunction  S:Taking Sildenafil 100 mg prn.  Working well A/P: Requests refill to [facility]  Recommended follow up: 2-week labs.  24-month and 1 day after that for repeat visit  Lab/Order associations:   ICD-10-CM   1. Type 2 diabetes mellitus with neurological complications (HCC)  [id number] Hemoglobin [id number]    Basic metabolic panel  2. Essential hypertension  I10   3. Hyperlipidemia, unspecified hyperlipidemia type  E78.5   4. Diabetic polyneuropathy associated with type 2 diabetes mellitus (HCC)  E11.42   5. S/P BKA (below knee amputation) unilateral, left ([place])  Z89.512   6. Preventative health care  Z00.00 sildenafil (VIAGRA) 100 MG tablet    Meds ordered this encounter  Medications  . metFORMIN (GLUCOPHAGE) 1000  MG tablet    Sig: Take 1 tablet (1,000 mg total) by mouth 2 (two) times daily with a meal.    Dispense:  180 tablet    Refill:  3  . sildenafil (VIAGRA) 100 MG tablet    Sig: Take 1 tablet (100 mg total) by mouth daily as needed for erectile dysfunction.    Dispense:  90 tablet    Refill:  0  . rosuvastatin (CRESTOR) 10 MG tablet    Sig: Take 1 tablet (10 mg total) by mouth daily.    Dispense:  90 tablet    Refill:  3  . ramipril (ALTACE) 10 MG capsule    Sig: Take 1 capsule (10 mg total) by mouth daily.    Dispense:  90 capsule    Refill:  3    Return precautions advised.  Garret Reddish, MD

## 2019-01-17 ENCOUNTER — Ambulatory Visit (INDEPENDENT_AMBULATORY_CARE_PROVIDER_SITE_OTHER): Payer: BC Managed Care – PPO | Admitting: Family Medicine

## 2019-01-17 ENCOUNTER — Encounter: Payer: Self-pay | Admitting: Family Medicine

## 2019-01-17 ENCOUNTER — Telehealth: Payer: Self-pay | Admitting: Orthopedic Surgery

## 2019-01-17 DIAGNOSIS — E11621 Type 2 diabetes mellitus with foot ulcer: Secondary | ICD-10-CM | POA: Diagnosis not present

## 2019-01-17 DIAGNOSIS — L97519 Non-pressure chronic ulcer of other part of right foot with unspecified severity: Secondary | ICD-10-CM | POA: Diagnosis not present

## 2019-01-17 MED ORDER — AMOXICILLIN-POT CLAVULANATE 875-125 MG PO TABS: 1.0000 | ORAL_TABLET | Freq: Two times a day (BID) | ORAL | 0 refills | Status: DC

## 2019-01-17 NOTE — Telephone Encounter (Signed)
Patient was called and informed that we need to see him in office before administering ABX since he has not been in office since 2018. Patient understood and is scheduled for 01/22/2019 for foot ulcer. Patient stated he will call PCP in meantime to get ABX

## 2019-01-17 NOTE — Telephone Encounter (Signed)
Pt lmom requesting an antibiotic states he has a bad foot ulcer that he thinks is infected.

## 2019-01-17 NOTE — Telephone Encounter (Signed)
Can you please call pt. He needs an appt

## 2019-01-17 NOTE — Progress Notes (Signed)
    Chief Complaint:  Luis Lane is a 53 y.o. male who presents today for a virtual office visit with a chief complaint of foot pain.   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 No signs of sepsis. Will start augmentin. He has orthopedics follow up scheduled for next week. Discussed strict reasons to return to care and seek emergent care.     Subjective:  HPI:  Foot Pain Started a few weeks ago. Located on the bottom of his right foot. He is concerned about an infected ulcer in the area. Pain seems to have worsened over the last couple of days. He is having some drainage. Occasional fevers. No treatments tried. No other obvious aggravating or alleviating factors.   ROS: Per HPI  PMH: He reports that he quit smoking about 8 years ago. He has a 15.00 pack-year smoking history. He has never used smokeless tobacco. He reports that he does not drink alcohol or use drugs.      Objective/Observations  Physical Exam: Gen: NAD, resting comfortably Pulm: Normal work of breathing Neuro: Grossly normal, moves all extremities Psych: Normal affect and thought content MSK: Approximately 6cm ulcer on base of right foot and 1 cm ulcer on later great toe.   Virtual Visit via Video   I connected with SUJIT STADTMILLER on 01/17/19 at  4:00 PM EDT by a video enabled telemedicine application and verified that I am speaking with the correct person using two identifiers. I discussed the limitations of evaluation and management by telemedicine and the availability of in person appointments. The patient expressed understanding and agreed to proceed.   Patient location: Home Provider location: [place] participating in the virtual visit: Myself and Patient     Algis Greenhouse. Jerline Pain, MD 01/17/2019 3:14 PM

## 2019-01-18 ENCOUNTER — Other Ambulatory Visit (INDEPENDENT_AMBULATORY_CARE_PROVIDER_SITE_OTHER): Payer: BC Managed Care – PPO

## 2019-01-18 ENCOUNTER — Encounter: Payer: Self-pay | Admitting: Family Medicine

## 2019-01-18 ENCOUNTER — Other Ambulatory Visit: Payer: Self-pay

## 2019-01-18 ENCOUNTER — Ambulatory Visit (INDEPENDENT_AMBULATORY_CARE_PROVIDER_SITE_OTHER): Payer: BC Managed Care – PPO

## 2019-01-18 DIAGNOSIS — Z23 Encounter for immunization: Secondary | ICD-10-CM

## 2019-01-18 DIAGNOSIS — E1149 Type 2 diabetes mellitus with other diabetic neurological complication: Secondary | ICD-10-CM

## 2019-01-18 LAB — BASIC METABOLIC PANEL
BUN: 13 mg/dL (ref 6–23)
CO2: 23 mEq/L (ref 19–32)
Calcium: 9.1 mg/dL (ref 8.4–10.5)
Chloride: 99 mEq/L (ref 96–112)
Creatinine, Ser: 0.97 mg/dL (ref 0.40–1.50)
GFR: 81.61 mL/min (ref >60.00)
Glucose, Bld: 315 mg/dL — ABNORMAL HIGH (ref 70–99)
Potassium: 4.3 mEq/L (ref 3.5–5.1)
Sodium: 134 mEq/L — ABNORMAL LOW (ref 135–145)

## 2019-01-18 LAB — HEMOGLOBIN A1C: Hgb A1c MFr Bld: 9.3 % — ABNORMAL HIGH (ref 4.6–6.5)

## 2019-01-18 NOTE — Progress Notes (Signed)
Kidney is doing okay.  Sugars are far too high at 315.  A1c slightly improved to 9.3-have him continue 22 units of insulin in the morning and 11 units in the evening and update me in 2 weeks with blood sugars

## 2019-01-22 ENCOUNTER — Other Ambulatory Visit: Payer: Self-pay

## 2019-01-22 ENCOUNTER — Ambulatory Visit (INDEPENDENT_AMBULATORY_CARE_PROVIDER_SITE_OTHER): Payer: BC Managed Care – PPO | Admitting: Family

## 2019-01-22 ENCOUNTER — Encounter: Payer: Self-pay | Admitting: Family

## 2019-01-22 VITALS — Ht 76.0 in | Wt 220.0 lb

## 2019-01-22 DIAGNOSIS — L97511 Non-pressure chronic ulcer of other part of right foot limited to breakdown of skin: Secondary | ICD-10-CM | POA: Diagnosis not present

## 2019-01-22 NOTE — Progress Notes (Addendum)
Office Visit Note   Patient: Luis Lane           Date of Birth: 03/27/1965           MRN: [id number] Visit Date: 01/22/2019              Requested by: Marin Olp, MD [place],  [place] [address] PCP: Marin Olp, MD   Assessment & Plan: Visit Diagnoses: No diagnosis found.  Plan: New custom orthotics for right foot with carbon fiber plate. New left BKA prosthetic as previous prosthetic has broken down. Rx provided.  He will finish course of amoxicillin provided by PCP as that seems to helped significantly. Continue compression sock. Follow up 2 weeks for re evaluation of of right foot.  Follow-Up Instructions: No follow-ups on file.   Orders:  No orders of the defined types were placed in this encounter.  No orders of the defined types were placed in this encounter.     Procedures: No procedures performed   Clinical Data: No additional findings.   Subjective: Chief Complaint  Patient presents with  . 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 Also history of BKA on the left. A few weeks ago he obtained an Carbon Plate as he was concerned about stepping on nails at work. He did not have a new orthotic and over the course of the day noticed breakdown of his skin over the lesser metatarsal heads. He had some skin maceration , redness and yellow drainage. He did see his PCP who put him on a 10 day course of amoxicillin and he reports he now has very little drainage and it is clear. He is using is VIVE sock. He is also requesting a new prosthetic on the left as his has broken down.   Review of Systems   Objective: Vital Signs: Ht 6\' 4"  (1.93 m)   Wt 220 lb (99.8 kg)   BMI 26.78 kg/m   Physical Exam  Ortho Exam  Patient appears well, comfortable alert. Right foot: no foul odor. Foot is warm pink. Area of skin maceration, delamination beneath lesser metatarsal heads. No current drainage,erythema .  Small blood blister on inside of great toe. Offered to trim delaminated skin, patient declined. Left BKA Prosthetic is broken down in foot  Patient is an existing left transtibial  amputee.  Patient's current comorbidities are not expected to impact the ability to function with the prescribed prosthesis. Patient verbally communicates a strong desire to use a prosthesis. Patient currently requires mobility aids to ambulate without a prosthesis.  Expects not to use mobility aids with a new prosthesis.  Patient is a K3 level ambulator that spends a lot of time walking around on uneven terrain over obstacles, up and down stairs, and ambulates with a variable cadence.    Specialty Comments:  No specialty comments available.  Imaging: No results found.   PMFS History: Patient Active Problem List   Diagnosis Date Noted  . History of adenomatous polyp of colon 11/05/2018  . Hyperlipidemia 04/21/2016  . Type 2 diabetes mellitus with neurological complications ([place]) 99991111  . S/P BKA (below knee amputation) unilateral, left ([place]) 04/21/2016  . Hypertension 04/21/2016  . Former smoker 04/21/2016  . Erectile dysfunction 04/21/2016  . Diabetic neuropathy ([place]) 04/21/2016   Past Medical History:  Diagnosis Date  . Chicken pox   . Diabetes mellitus type II, controlled ([place]) 04/21/2016  Left BKA 2013. Last a1c in November was 6.2. [facility] 07/2014. victoza 1.8, metformin 1g BID, lantus 70 BID--> tresiba due to insurance. Humalog 20 units 3x a day. Exercise helps. Nutritionist desired.   Marland Kitchen Hx of BKA, left ([place]) 04/21/2016   2013 duda  . Hyperlipidemia 04/21/2016   crestor 10mg , fish oil. Allergic to aspirin even 81mg - hives.   . Hypertension 04/21/2016   Ramipril 10mg  BID    Family History  Problem Relation Age of Onset  . Diabetes Mother        pt at [facility]  . Ovarian cancer Mother   . Hypertension Mother   . Diabetes Father   . Diabetes Brother   . Diabetic kidney disease  Brother         and retinopathy  . Alcohol abuse Maternal Grandmother        and some uncles. moms side  . Stroke Paternal Grandmother        smoker as well  . Heart attack Paternal Grandmother   . Diabetes Paternal Grandmother   . Colon cancer Paternal Uncle   . Esophageal cancer Neg Hx   . Rectal cancer Neg Hx   . Stomach cancer Neg Hx     Past Surgical History:  Procedure Laterality Date  . AMPUTATION     3rd toe right  . APPENDECTOMY     1974  . Left Leg Amputee Left 2013   Social History   Occupational History  . Not on file  Tobacco Use  . Smoking status: Former Smoker    Packs/day: 1.00    Years: 15.00    Pack years: 15.00    Quit date: 03/21/2010    Years since quitting: 8.8  . Smokeless tobacco: Never Used  Substance and Sexual Activity  . Alcohol use: No  . Drug use: No  . Sexual activity: Not on file

## 2019-01-29 MED ORDER — AMOXICILLIN-POT CLAVULANATE 875-125 MG PO TABS: 1.0000 | ORAL_TABLET | Freq: Two times a day (BID) | ORAL | 0 refills | Status: DC

## 2019-02-06 ENCOUNTER — Encounter: Payer: Self-pay | Admitting: Family Medicine

## 2019-02-08 ENCOUNTER — Encounter: Payer: Self-pay | Admitting: Family

## 2019-02-08 ENCOUNTER — Other Ambulatory Visit: Payer: Self-pay

## 2019-02-08 ENCOUNTER — Ambulatory Visit (INDEPENDENT_AMBULATORY_CARE_PROVIDER_SITE_OTHER): Payer: BC Managed Care – PPO | Admitting: Family

## 2019-02-08 VITALS — Ht 76.0 in | Wt 220.0 lb

## 2019-02-08 DIAGNOSIS — L97511 Non-pressure chronic ulcer of other part of right foot limited to breakdown of skin: Secondary | ICD-10-CM

## 2019-02-08 NOTE — Progress Notes (Signed)
Office Visit Note   Patient: Luis Lane           Date of Birth: 1965/12/25           MRN: [id number] Visit Date: 02/08/2019              Requested by: Marin Olp, MD [place],  [place] [address] PCP: Marin Olp, MD   Assessment & Plan: Visit Diagnoses:  1. Right foot ulcer, limited to breakdown of skin ([place])     Plan: antibacterial ointment and bandaid. Continue compression sock. Follow up 2 weeks for re evaluation of of right foot.  Follow-Up Instructions: Return in about 6 weeks (around 03/22/2019).   Orders:  No orders of the defined types were placed in this encounter.  No orders of the defined types were placed in this encounter.     Procedures: No procedures performed   Clinical Data: No additional findings.   Subjective: Chief Complaint  Patient presents with  . 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 Also history of BKA on the left.   The patient is seen today in follow-up he has an ulcer to the third metatarsal head on the right he unfortunately had put some new inserts in his shoes and rubbed a blister.  There is a central ulcer with some surrounding maceration  He did complete a amoxicillin course.  He has been using a vive sock.     Review of Systems  Constitutional: Negative for chills and fever.  Skin: Positive for wound. Negative for color change.     Objective: Vital Signs: Ht 6\' 4"  (1.93 m)   Wt 220 lb (99.8 kg)   BMI 26.78 kg/m   Physical Exam  Ortho Exam  Patient appears well, comfortable alert. On examination of the right foot to the third metatarsal head plantar aspect there is a Wagner grade 1 ulcer this is 7 mm in diameter filled in with beefy granulation tissue.  There is some yellow macerated skin surrounding there is no callus there is no odor there is no drainage no erythema  Specialty Comments:  No specialty comments available.  Imaging: No  results found.   PMFS History: Patient Active Problem List   Diagnosis Date Noted  . History of adenomatous polyp of colon 11/05/2018  . Hyperlipidemia 04/21/2016  . Type 2 diabetes mellitus with neurological complications ([name]) 99991111  . S/P BKA (below knee amputation) unilateral, left ([place]) 04/21/2016  . Hypertension 04/21/2016  . Former smoker 04/21/2016  . Erectile dysfunction 04/21/2016  . Diabetic neuropathy ([name]) 04/21/2016   Past Medical History:  Diagnosis Date  . Chicken pox   . Diabetes mellitus type II, controlled ([name]) 04/21/2016   Left BKA 2013. Last a1c in November was 6.2. [facility] 07/2014. victoza 1.8, metformin 1g BID, lantus 70 BID--> tresiba due to insurance. Humalog 20 units 3x a day. Exercise helps. Nutritionist desired.   Marland Kitchen Hx of BKA, left ([name]) 04/21/2016   2013 duda  . Hyperlipidemia 04/21/2016   crestor 10mg , fish oil. Allergic to aspirin even 81mg - hives.   . Hypertension 04/21/2016   Ramipril 10mg  BID    Family History  Problem Relation Age of Onset  . Diabetes Mother        pt at [facility]  . Ovarian cancer Mother   . Hypertension Mother   . Diabetes Father   . Diabetes Brother   .  Diabetic kidney disease Brother         and retinopathy  . Alcohol abuse Maternal Grandmother        and some uncles. moms side  . Stroke Paternal Grandmother        smoker as well  . Heart attack Paternal Grandmother   . Diabetes Paternal Grandmother   . Colon cancer Paternal Uncle   . Esophageal cancer Neg Hx   . Rectal cancer Neg Hx   . Stomach cancer Neg Hx     Past Surgical History:  Procedure Laterality Date  . AMPUTATION     3rd toe right  . APPENDECTOMY     1974  . Left Leg Amputee Left 2013   Social History   Occupational History  . Not on file  Tobacco Use  . Smoking status: Former Smoker    Packs/day: 1.00    Years: 15.00    Pack years: 15.00    Quit date: 03/21/2010    Years since quitting: 8.8  . Smokeless tobacco: Never Used   Substance and Sexual Activity  . Alcohol use: No  . Drug use: No  . Sexual activity: Not on file

## 2019-02-19 MED ORDER — AMOXICILLIN-POT CLAVULANATE 875-125 MG PO TABS: 1.0000 | ORAL_TABLET | Freq: Two times a day (BID) | ORAL | 0 refills | Status: DC

## 2019-02-19 NOTE — Telephone Encounter (Signed)
Can you call and offer an appt in 2 weeks

## 2019-02-28 ENCOUNTER — Other Ambulatory Visit: Payer: Self-pay | Admitting: Family Medicine

## 2019-03-26 ENCOUNTER — Encounter: Payer: Self-pay | Admitting: Family Medicine

## 2019-04-10 ENCOUNTER — Encounter: Payer: Self-pay | Admitting: Family Medicine

## 2019-04-11 ENCOUNTER — Other Ambulatory Visit: Payer: Self-pay

## 2019-04-11 MED ORDER — INSULIN NPH ISOPHANE & REGULAR (70-30) 100 UNIT/ML ~~LOC~~ SUSP: SUBCUTANEOUS | 11 refills | Status: DC

## 2019-04-17 NOTE — Telephone Encounter (Signed)
see

## 2019-05-09 ENCOUNTER — Encounter: Payer: Self-pay | Admitting: Family

## 2019-05-10 ENCOUNTER — Other Ambulatory Visit: Payer: Self-pay | Admitting: Family

## 2019-05-10 ENCOUNTER — Encounter: Payer: Self-pay | Admitting: Family Medicine

## 2019-05-10 MED ORDER — AMOXICILLIN-POT CLAVULANATE 875-125 MG PO TABS: 1.0000 | ORAL_TABLET | Freq: Two times a day (BID) | ORAL | 0 refills | Status: DC

## 2019-05-10 NOTE — Telephone Encounter (Signed)
Do I need to change script for patient to get 2 per month?

## 2019-05-16 MED ORDER — INSULIN NPH ISOPHANE & REGULAR (70-30) 100 UNIT/ML ~~LOC~~ SUSP: SUBCUTANEOUS | 5 refills | Status: DC

## 2019-06-13 ENCOUNTER — Encounter: Payer: Self-pay | Admitting: Family

## 2019-06-13 ENCOUNTER — Other Ambulatory Visit: Payer: Self-pay | Admitting: Family

## 2019-06-14 ENCOUNTER — Telehealth: Payer: Self-pay | Admitting: Orthopedic Surgery

## 2019-06-14 ENCOUNTER — Other Ambulatory Visit: Payer: Self-pay

## 2019-06-14 ENCOUNTER — Ambulatory Visit: Payer: Self-pay

## 2019-06-14 ENCOUNTER — Ambulatory Visit (INDEPENDENT_AMBULATORY_CARE_PROVIDER_SITE_OTHER): Payer: BC Managed Care – PPO | Admitting: Family

## 2019-06-14 ENCOUNTER — Encounter: Payer: Self-pay | Admitting: Family

## 2019-06-14 DIAGNOSIS — L97511 Non-pressure chronic ulcer of other part of right foot limited to breakdown of skin: Secondary | ICD-10-CM

## 2019-06-14 NOTE — Progress Notes (Signed)
Office Visit Note   Patient: Luis Lane           Date of Birth: [birth year]           MRN: [id number] Visit Date: 06/14/2019              Requested by: Marin Olp, MD [place],  [place] [address] PCP: Marin Olp, MD  No chief complaint on file.     HPI: The patient is a 54 year old gentleman who presents today with ulcer to his right foot plantar aspect.  He has been having issues with a Wagner grade 1 ulcer for quite some time.  He has had repeated infections last infection was about a month ago treated with a course of Bactrim for 2 weeks this resolved well the patient unfortunately was out of town he states about 3 to 4 days after stopping the antibiotics redness and swelling returned to his foot.  He has been treating the plantar ulcer with antibacterial ointment dressing changes.  He continues full weightbearing.  Of note he is status post below-knee amputation on the left.  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  Assessment & Plan: Visit Diagnoses:  1. 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  With ulcer probing down to bone.  The patient would like to proceed with limb salvage surgery.  Discussed possibility of transmetatarsal amputation.  Patient strongly would prefer a ray amputation we will discussed with Dr. Sharol Given.  We will go ahead and pre-CERT him for surgery.  He prefers the date of April 16 as early state for surgery.  As he will be going out of town on April 9.  Follow-Up Instructions: No follow-ups on file.   Ortho Exam  Patient is alert, oriented, no adenopathy, well-dressed, normal affect, normal respiratory effort. On examination of the right foot he has erythema over the forefoot.  There is some swelling of the second and third toes.  There is a plantar ulcer over the third metatarsal head this is 1 cm by  half a centimeter in diameter there is granulation in the wound bed only about 50% fibrinous tissue.  Unfortunately this ulcer does probe down to bone.  There is no active drainage no foul odor.  He does have a palpable dorsalis pedis pulse.  Imaging: No results found. No images are attached to the encounter.  Labs: Lab Results  Component Value Date   HGBA1C 9.3 (H) 01/18/2019   HGBA1C 10.6 (H) 06/01/2018   HGBA1C 7.7 07/10/2017   ESRSEDRATE 19 (H) 11/05/2006   REPTSTATUS 08/01/2009 FINAL 07/29/2009   GRAMSTAIN  07/29/2009    MODERATE WBC PRESENT, PREDOMINANTLY PMN RARE SQUAMOUS EPITHELIAL CELLS PRESENT MODERATE GRAM POSITIVE COCCI IN PAIRS MODERATE GRAM NEGATIVE RODS   CULT  07/29/2009    MODERATE GROUP B STREP(S.AGALACTIAE)ISOLATED Note: TESTING AGAINST S. AGALACTIAE NOT ROUTINELY PERFORMED DUE TO PREDICTABILITY OF AMP/PEN/VAN SUSCEPTIBILITY.     Lab Results  Component Value Date   ALBUMIN 4.8 06/01/2018   ALBUMIN 4.5 03/10/2017   ALBUMIN 4.8 04/21/2016    No results found for: MG No results found for: VD25OH  No results found for: PREALBUMIN CBC EXTENDED Latest Ref Rng & Units 06/01/2018 04/21/2016 07/29/2009  WBC 4.0 - 10.5 K/uL 5.8 6.9 12.0(H)  RBC 4.22 - 5.81 Mil/uL 4.97 5.11 4.10(L)  HGB  13.0 - 17.0 g/dL 15.3 15.3 12.4(L)  HCT 39.0 - 52.0 % 43.9 45.5 35.6(L)  PLT 150.0 - 400.0 K/uL 168.0 165.0 206  NEUTROABS - - - -  LYMPHSABS - - - -     There is no height or weight on file to calculate BMI.  Orders:  Orders Placed This Encounter  Procedures  . XR Foot 2 Views Right   No orders of the defined types were placed in this encounter.    Procedures: No procedures performed  Clinical Data: No additional findings.  ROS:  All other systems negative, except as noted in the HPI. Review of Systems  Constitutional: Negative for chills and fever.  Skin: Positive for color change and wound.    Objective: Vital Signs: There were no vitals taken for this  visit.  Specialty Comments:  No specialty comments available.  PMFS History: Patient Active Problem List   Diagnosis Date Noted  . History of adenomatous polyp of colon 11/05/2018  . Hyperlipidemia 04/21/2016  . Type 2 diabetes mellitus with neurological complications ([place]) 99991111  . S/P BKA (below knee amputation) unilateral, left ([place]) 04/21/2016  . Hypertension 04/21/2016  . Former smoker 04/21/2016  . Erectile dysfunction 04/21/2016  . Diabetic neuropathy ([place]) 04/21/2016   Past Medical History:  Diagnosis Date  . Chicken pox   . Diabetes mellitus type II, controlled ([place]) 04/21/2016   Left BKA 2013. Last a1c in November was 6.2. [facility] 07/2014. victoza 1.8, metformin 1g BID, lantus 70 BID--> tresiba due to insurance. Humalog 20 units 3x a day. Exercise helps. Nutritionist desired.   Marland Kitchen Hx of BKA, left ([place]) 04/21/2016   2013 duda  . Hyperlipidemia 04/21/2016   crestor 10mg , fish oil. Allergic to aspirin even 81mg - hives.   . Hypertension 04/21/2016   Ramipril 10mg  BID    Family History  Problem Relation Age of Onset  . Diabetes Mother        pt at [facility]  . Ovarian cancer Mother   . Hypertension Mother   . Diabetes Father   . Diabetes Brother   . Diabetic kidney disease Brother         and retinopathy  . Alcohol abuse Maternal Grandmother        and some uncles. moms side  . Stroke Paternal Grandmother        smoker as well  . Heart attack Paternal Grandmother   . Diabetes Paternal Grandmother   . Colon cancer Paternal Uncle   . Esophageal cancer Neg Hx   . Rectal cancer Neg Hx   . Stomach cancer Neg Hx     Past Surgical History:  Procedure Laterality Date  . AMPUTATION     3rd toe right  . APPENDECTOMY     1974  . Left Leg Amputee Left 2013   Social History   Occupational History  . Not on file  Tobacco Use  . Smoking status: Former Smoker    Packs/day: 1.00    Years: 15.00    Pack years: 15.00    Quit date: 03/21/2010    Years since  quitting: 9.2  . Smokeless tobacco: Never Used  Substance and Sexual Activity  . Alcohol use: No  . Drug use: No  . Sexual activity: Not on file

## 2019-06-14 NOTE — Telephone Encounter (Signed)
Pt called in stating he had an appt on 06/14/19 with Junie Panning and she told him she would send in some antibiotics and the pt states the pharmacy still hasn't received.   [phone]

## 2019-06-16 ENCOUNTER — Other Ambulatory Visit: Payer: Self-pay | Admitting: Family

## 2019-06-16 ENCOUNTER — Encounter: Payer: Self-pay | Admitting: Family

## 2019-06-17 ENCOUNTER — Encounter: Payer: Self-pay | Admitting: Family

## 2019-06-17 ENCOUNTER — Other Ambulatory Visit: Payer: Self-pay | Admitting: Physician Assistant

## 2019-06-17 MED ORDER — DOXYCYCLINE HYCLATE 100 MG PO TABS: 100.0000 mg | ORAL_TABLET | Freq: Two times a day (BID) | ORAL | 0 refills | Status: AC

## 2019-06-17 NOTE — Telephone Encounter (Signed)
Could you please advise? Per Erin's note, patient is going to be scheduled for surgery.

## 2019-06-17 NOTE — Telephone Encounter (Signed)
Rx for doxycycline called into [facility] on [address]

## 2019-06-17 NOTE — Telephone Encounter (Signed)
I called patient and advised. 

## 2019-06-18 MED ORDER — AMOXICILLIN-POT CLAVULANATE 875-125 MG PO TABS: 1.0000 | ORAL_TABLET | Freq: Two times a day (BID) | ORAL | 0 refills | Status: AC

## 2019-06-19 NOTE — Telephone Encounter (Signed)
Spoke with patient. Questions addressed.  He has appointment with Dr. Sharol Given in the morning.

## 2019-06-20 ENCOUNTER — Other Ambulatory Visit: Payer: Self-pay

## 2019-06-20 ENCOUNTER — Encounter: Payer: Self-pay | Admitting: Orthopedic Surgery

## 2019-06-20 ENCOUNTER — Ambulatory Visit (INDEPENDENT_AMBULATORY_CARE_PROVIDER_SITE_OTHER): Payer: BC Managed Care – PPO | Admitting: Orthopedic Surgery

## 2019-06-20 VITALS — Ht 76.0 in | Wt 220.0 lb

## 2019-06-20 DIAGNOSIS — L97511 Non-pressure chronic ulcer of other part of right foot limited to breakdown of skin: Secondary | ICD-10-CM

## 2019-06-27 ENCOUNTER — Encounter: Payer: Self-pay | Admitting: Orthopedic Surgery

## 2019-06-27 NOTE — Progress Notes (Signed)
Office Visit Note   Patient: Luis Lane           Date of Birth: 1966-02-07           MRN: [id number] Visit Date: 06/20/2019              Requested by: Luis Olp, MD [place],  [place] [address] PCP: Luis Olp, MD  Chief Complaint  Patient presents with  . Right Foot - Follow-up      HPI: Patient is a 54 year old gentleman who is seen in follow-up for ulceration right foot.  Assessment & Plan: Visit Diagnoses:  1. Right foot ulcer, limited to breakdown of skin ([place])     Plan: Patient's ulcer is stable and healthy.  We will make him a new felt relieving donut for his [place]co shoe.  Follow-Up Instructions: Return in about 4 weeks (around 07/18/2019).   Ortho Exam  Patient is alert, oriented, no adenopathy, well-dressed, normal affect, normal respiratory effort. Examination patient has a good dorsalis pedis pulse he has a Wagner grade 1 ulcer beneath the second metatarsal head.  After informed consent a 10 blade knife was used to debride the skin and soft tissue back to healthy viable granulation tissue is 2 mm deep there is no exposed bone or tendon no drainage no odor no signs of infection.  Imaging: No results found. No images are attached to the encounter.  Labs: Lab Results  Component Value Date   HGBA1C 9.3 (H) 01/18/2019   HGBA1C 10.6 (H) 06/01/2018   HGBA1C 7.7 07/10/2017   ESRSEDRATE 19 (H) 11/05/2006   REPTSTATUS 08/01/2009 FINAL 07/29/2009   GRAMSTAIN  07/29/2009    MODERATE WBC PRESENT, PREDOMINANTLY PMN RARE SQUAMOUS EPITHELIAL CELLS PRESENT MODERATE GRAM POSITIVE COCCI IN PAIRS MODERATE GRAM NEGATIVE RODS   CULT  07/29/2009    MODERATE GROUP B STREP(S.AGALACTIAE)ISOLATED Note: TESTING AGAINST S. AGALACTIAE NOT ROUTINELY PERFORMED DUE TO PREDICTABILITY OF AMP/PEN/VAN SUSCEPTIBILITY.     Lab Results  Component Value Date   ALBUMIN 4.8 06/01/2018   ALBUMIN 4.5 03/10/2017   ALBUMIN 4.8 04/21/2016    No results  found for: MG No results found for: VD25OH  No results found for: PREALBUMIN CBC EXTENDED Latest Ref Rng & Units 06/01/2018 04/21/2016 07/29/2009  WBC 4.0 - 10.5 K/uL 5.8 6.9 12.0(H)  RBC 4.22 - 5.81 Mil/uL 4.97 5.11 4.10(L)  HGB 13.0 - 17.0 g/dL 15.3 15.3 12.4(L)  HCT 39.0 - 52.0 % 43.9 45.5 35.6(L)  PLT 150.0 - 400.0 K/uL 168.0 165.0 206  NEUTROABS - - - -  LYMPHSABS - - - -     Body mass index is 26.78 kg/m.  Orders:  No orders of the defined types were placed in this encounter.  No orders of the defined types were placed in this encounter.    Procedures: No procedures performed  Clinical Data: No additional findings.  ROS:  All other systems negative, except as noted in the HPI. Review of Systems  Objective: Vital Signs: Ht 6\' 4"  (1.93 m)   Wt 220 lb (99.8 kg)   BMI 26.78 kg/m   Specialty Comments:  No specialty comments available.  PMFS History: Patient Active Problem List   Diagnosis Date Noted  . History of adenomatous polyp of colon 11/05/2018  . Hyperlipidemia 04/21/2016  . Type 2 diabetes mellitus with neurological complications ([place]) 99991111  . S/P BKA (below knee amputation) unilateral, left ([place]) 04/21/2016  . Hypertension 04/21/2016  . Former smoker  04/21/2016  . Erectile dysfunction 04/21/2016  . Diabetic neuropathy ([place]) 04/21/2016   Past Medical History:  Diagnosis Date  . Chicken pox   . Diabetes mellitus type II, controlled ([place]) 04/21/2016   Left BKA 2013. Last a1c in November was 6.2. [facility] 07/2014. victoza 1.8, metformin 1g BID, lantus 70 BID--> tresiba due to insurance. Humalog 20 units 3x a day. Exercise helps. Nutritionist desired.   Marland Kitchen Hx of BKA, left ([place]) 04/21/2016   2013 Luis Lane  . Hyperlipidemia 04/21/2016   crestor 10mg , fish oil. Allergic to aspirin even 81mg - hives.   . Hypertension 04/21/2016   Ramipril 10mg  BID    Family History  Problem Relation Age of Onset  . Diabetes Mother        pt at [facility]  . Ovarian  cancer Mother   . Hypertension Mother   . Diabetes Father   . Diabetes Brother   . Diabetic kidney disease Brother         and retinopathy  . Alcohol abuse Maternal Grandmother        and some uncles. moms side  . Stroke Paternal Grandmother        smoker as well  . Heart attack Paternal Grandmother   . Diabetes Paternal Grandmother   . Colon cancer Paternal Uncle   . Esophageal cancer Neg Hx   . Rectal cancer Neg Hx   . Stomach cancer Neg Hx     Past Surgical History:  Procedure Laterality Date  . AMPUTATION     3rd toe right  . APPENDECTOMY     1974  . Left Leg Amputee Left 2013   Social History   Occupational History  . Not on file  Tobacco Use  . Smoking status: Former Smoker    Packs/day: 1.00    Years: 15.00    Pack years: 15.00    Quit date: 03/21/2010    Years since quitting: 9.2  . Smokeless tobacco: Never Used  Substance and Sexual Activity  . Alcohol use: No  . Drug use: No  . Sexual activity: Not on file

## 2019-07-22 ENCOUNTER — Encounter: Payer: Self-pay | Admitting: Orthopedic Surgery

## 2019-07-22 ENCOUNTER — Ambulatory Visit (INDEPENDENT_AMBULATORY_CARE_PROVIDER_SITE_OTHER): Payer: BC Managed Care – PPO | Admitting: Physician Assistant

## 2019-07-22 ENCOUNTER — Other Ambulatory Visit: Payer: Self-pay

## 2019-07-22 VITALS — Ht 76.0 in | Wt 220.0 lb

## 2019-07-22 DIAGNOSIS — L97511 Non-pressure chronic ulcer of other part of right foot limited to breakdown of skin: Secondary | ICD-10-CM

## 2019-07-22 NOTE — Progress Notes (Signed)
Office Visit Note   Patient: Luis Lane           Date of Birth: [birth year]           MRN: [id number] Visit Date: 07/22/2019              Requested by: Marin Olp, MD [place],  [place] [address] PCP: Marin Olp, MD  Chief Complaint  Patient presents with  . Right Foot - Follow-up    Ulcer follow up       HPI: This is a pleasant 54 year old gentleman who follows up today for a recheck of his right plantar forefoot ulcer.  He feels he is making good progress with monthly debridements.  He has a doughnut underneath his orthotic    Assessment & Plan: Visit Diagnoses: No diagnosis found.  Plan: I have given him another doughnut for his shoe.  He will follow-up in 1 month  Follow-Up Instructions: No follow-ups on file.   Ortho Exam  Patient is alert, oriented, no adenopathy, well-dressed, normal affect, normal respiratory effort. Focused examination of his right foot demonstrates 1 x 1 cm plantar ulcer which has hyper granulation healthy tissue but it is about this above the skin surface.  Surrounding callus.  No surrounding erythema cellulitis.  After verbal consent the callus was trimmed to softer surfaces.  The hyper granulation tissue was touched with a silver nitrate stick.  The overall diameter of the ulcer did not change but the surrounding callus was trimmed down to a healthy soft tissue follow-up in 1 month  Imaging: No results found. No images are attached to the encounter.  Labs: Lab Results  Component Value Date   HGBA1C 9.3 (H) 01/18/2019   HGBA1C 10.6 (H) 06/01/2018   HGBA1C 7.7 07/10/2017   ESRSEDRATE 19 (H) 11/05/2006   REPTSTATUS 08/01/2009 FINAL 07/29/2009   GRAMSTAIN  07/29/2009    MODERATE WBC PRESENT, PREDOMINANTLY PMN RARE SQUAMOUS EPITHELIAL CELLS PRESENT MODERATE GRAM POSITIVE COCCI IN PAIRS MODERATE GRAM NEGATIVE RODS   CULT  07/29/2009    MODERATE GROUP B STREP(S.AGALACTIAE)ISOLATED Note: TESTING AGAINST S.  AGALACTIAE NOT ROUTINELY PERFORMED DUE TO PREDICTABILITY OF AMP/PEN/VAN SUSCEPTIBILITY.     Lab Results  Component Value Date   ALBUMIN 4.8 06/01/2018   ALBUMIN 4.5 03/10/2017   ALBUMIN 4.8 04/21/2016    No results found for: MG No results found for: VD25OH  No results found for: PREALBUMIN CBC EXTENDED Latest Ref Rng & Units 06/01/2018 04/21/2016 07/29/2009  WBC 4.0 - 10.5 K/uL 5.8 6.9 12.0(H)  RBC 4.22 - 5.81 Mil/uL 4.97 5.11 4.10(L)  HGB 13.0 - 17.0 g/dL 15.3 15.3 12.4(L)  HCT 39.0 - 52.0 % 43.9 45.5 35.6(L)  PLT 150.0 - 400.0 K/uL 168.0 165.0 206  NEUTROABS - - - -  LYMPHSABS - - - -     Body mass index is 26.78 kg/m.  Orders:  No orders of the defined types were placed in this encounter.  No orders of the defined types were placed in this encounter.    Procedures: No procedures performed  Clinical Data: No additional findings.  ROS:  All other systems negative, except as noted in the HPI. Review of Systems  Objective: Vital Signs: Ht 6\' 4"  (1.93 m)   Wt 220 lb (99.8 kg)   BMI 26.78 kg/m   Specialty Comments:  No specialty comments available.  PMFS History: Patient Active Problem List   Diagnosis Date Noted  . History of adenomatous  polyp of colon 11/05/2018  . Hyperlipidemia 04/21/2016  . Type 2 diabetes mellitus with neurological complications ([place]) 99991111  . S/P BKA (below knee amputation) unilateral, left ([place]) 04/21/2016  . Hypertension 04/21/2016  . Former smoker 04/21/2016  . Erectile dysfunction 04/21/2016  . Diabetic neuropathy ([place]) 04/21/2016   Past Medical History:  Diagnosis Date  . Chicken pox   . Diabetes mellitus type II, controlled ([place]) 04/21/2016   Left BKA 2013. Last a1c in November was 6.2. [facility] 07/2014. victoza 1.8, metformin 1g BID, lantus 70 BID--> tresiba due to insurance. Humalog 20 units 3x a day. Exercise helps. Nutritionist desired.   Marland Kitchen Hx of BKA, left ([place]) 04/21/2016   2013 duda  . Hyperlipidemia  04/21/2016   crestor 10mg , fish oil. Allergic to aspirin even 81mg - hives.   . Hypertension 04/21/2016   Ramipril 10mg  BID    Family History  Problem Relation Age of Onset  . Diabetes Mother        pt at [facility]  . Ovarian cancer Mother   . Hypertension Mother   . Diabetes Father   . Diabetes Brother   . Diabetic kidney disease Brother         and retinopathy  . Alcohol abuse Maternal Grandmother        and some uncles. moms side  . Stroke Paternal Grandmother        smoker as well  . Heart attack Paternal Grandmother   . Diabetes Paternal Grandmother   . Colon cancer Paternal Uncle   . Esophageal cancer Neg Hx   . Rectal cancer Neg Hx   . Stomach cancer Neg Hx     Past Surgical History:  Procedure Laterality Date  . AMPUTATION     3rd toe right  . APPENDECTOMY     1974  . Left Leg Amputee Left 2013   Social History   Occupational History  . Not on file  Tobacco Use  . Smoking status: Former Smoker    Packs/day: 1.00    Years: 15.00    Pack years: 15.00    Quit date: 03/21/2010    Years since quitting: 9.3  . Smokeless tobacco: Never Used  Substance and Sexual Activity  . Alcohol use: No  . Drug use: No  . Sexual activity: Not on file

## 2019-08-01 ENCOUNTER — Telehealth: Payer: Self-pay | Admitting: Orthopedic Surgery

## 2019-08-01 NOTE — Telephone Encounter (Signed)
Received call from Mechele Claude w/ [employer] of [facility] requesting notes for an appeal on the denial for new prosthesis. Fax [phone], ph [phone] ext [phone]. I faxed 01/22/2019 ov note

## 2019-08-07 ENCOUNTER — Telehealth: Payer: Self-pay | Admitting: Orthopedic Surgery

## 2019-08-07 NOTE — Telephone Encounter (Signed)
Received call from [place] with [place] stating the fax did not go through and asked if it can be faxed again? I advised the ov note was faxed 08/01/2019

## 2019-08-08 NOTE — Telephone Encounter (Signed)
I will resend.

## 2019-08-15 ENCOUNTER — Ambulatory Visit (INDEPENDENT_AMBULATORY_CARE_PROVIDER_SITE_OTHER): Payer: BC Managed Care – PPO | Admitting: Orthopedic Surgery

## 2019-08-15 ENCOUNTER — Other Ambulatory Visit: Payer: Self-pay

## 2019-08-15 ENCOUNTER — Encounter: Payer: Self-pay | Admitting: Orthopedic Surgery

## 2019-08-15 DIAGNOSIS — L97511 Non-pressure chronic ulcer of other part of right foot limited to breakdown of skin: Secondary | ICD-10-CM | POA: Diagnosis not present

## 2019-08-15 DIAGNOSIS — Z89512 Acquired absence of left leg below knee: Secondary | ICD-10-CM

## 2019-08-15 NOTE — Progress Notes (Signed)
Office Visit Note   Patient: Luis Lane           Date of Birth: [birth year]           MRN: [id number] Visit Date: 08/15/2019              Requested by: Marin Olp, MD [place],  [place] [address] PCP: Marin Olp, MD  Chief Complaint  Patient presents with  . Right Foot - Pain      HPI: Patient is a 54 year old gentleman who was seen in follow-up for 2 separate issues #1 Wagner grade 1 ulcer plantar aspect of the right foot #2 left transtibial amputee. Patient has been having increasing problems with the socket.  The liner is torn and is too big for his leg he has significant volume loss in the leg  Assessment & Plan: Visit Diagnoses:  1. Right foot ulcer, limited to breakdown of skin ([place])   2. Left below-knee amputee [facility])     Plan:  .  Patient is a K3 level ambulator he will need a new liner new socket new materials and supplies for his left transtibial amputation.  A prescription was provided for biotech.  Follow-Up Instructions: Return if symptoms worsen or fail to improve.   Ortho Exam  Patient is alert, oriented, no adenopathy, well-dressed, normal affect, normal respiratory effort.  Patient has an antalgic gait with shortening of the left lower extremity.  Patient has been wearing 20-30 ply socks over the silicone liner. Patient is currently subsiding into his socket and has pressure ulcers from the subsidence over the medial tibial plateau the fibular head the inferior pole of the patella and the end of the residual limb.  There is no cellulitis odor or drainage.  Patient has been quite active he is able to ambulate at a different cadence ambulate up and down bleachers and will require a new socket liner materials and supplies for safe ambulation.  Examination of the right foot patient has almost completely healed a Wagner grade 1 ulcer that is 1 mm diameter 5 mm long with no redness no cellulitis no signs of infection.  Imaging: No  results found.   Labs: Lab Results  Component Value Date   HGBA1C 9.3 (H) 01/18/2019   HGBA1C 10.6 (H) 06/01/2018   HGBA1C 7.7 07/10/2017   ESRSEDRATE 19 (H) 11/05/2006   REPTSTATUS 08/01/2009 FINAL 07/29/2009   GRAMSTAIN  07/29/2009    MODERATE WBC PRESENT, PREDOMINANTLY PMN RARE SQUAMOUS EPITHELIAL CELLS PRESENT MODERATE GRAM POSITIVE COCCI IN PAIRS MODERATE GRAM NEGATIVE RODS   CULT  07/29/2009    MODERATE GROUP B STREP(S.AGALACTIAE)ISOLATED Note: TESTING AGAINST S. AGALACTIAE NOT ROUTINELY PERFORMED DUE TO PREDICTABILITY OF AMP/PEN/VAN SUSCEPTIBILITY.     Lab Results  Component Value Date   ALBUMIN 4.8 06/01/2018   ALBUMIN 4.5 03/10/2017   ALBUMIN 4.8 04/21/2016    No results found for: MG No results found for: VD25OH  No results found for: PREALBUMIN CBC EXTENDED Latest Ref Rng & Units 06/01/2018 04/21/2016 07/29/2009  WBC 4.0 - 10.5 K/uL 5.8 6.9 12.0(H)  RBC 4.22 - 5.81 Mil/uL 4.97 5.11 4.10(L)  HGB 13.0 - 17.0 g/dL 15.3 15.3 12.4(L)  HCT 39.0 - 52.0 % 43.9 45.5 35.6(L)  PLT 150.0 - 400.0 K/uL 168.0 165.0 206  NEUTROABS - - - -  LYMPHSABS - - - -     There is no height or weight on file to calculate BMI.  Orders:  No  orders of the defined types were placed in this encounter.  No orders of the defined types were placed in this encounter.    Procedures: No procedures performed  Clinical Data: No additional findings.  ROS:  All other systems negative, except as noted in the HPI. Review of Systems  Objective: Vital Signs: There were no vitals taken for this visit.  Specialty Comments:  No specialty comments available.  PMFS History: Patient Active Problem List   Diagnosis Date Noted  . History of adenomatous polyp of colon 11/05/2018  . Hyperlipidemia 04/21/2016  . Type 2 diabetes mellitus with neurological complications ([place]) 99991111  . S/P BKA (below knee amputation) unilateral, left ([place]) 04/21/2016  . Hypertension 04/21/2016  . Former  smoker 04/21/2016  . Erectile dysfunction 04/21/2016  . Diabetic neuropathy ([place]) 04/21/2016   Past Medical History:  Diagnosis Date  . Chicken pox   . Diabetes mellitus type II, controlled ([place]) 04/21/2016   Left BKA 2013. Last a1c in November was 6.2. [facility] 07/2014. victoza 1.8, metformin 1g BID, lantus 70 BID--> tresiba due to insurance. Humalog 20 units 3x a day. Exercise helps. Nutritionist desired.   Marland Kitchen Hx of BKA, left ([place]) 04/21/2016   2013 [address]  . Hyperlipidemia 04/21/2016   crestor 10mg , fish oil. Allergic to aspirin even 81mg - hives.   . Hypertension 04/21/2016   Ramipril 10mg  BID    Family History  Problem Relation Age of Onset  . Diabetes Mother        pt at [facility]  . Ovarian cancer Mother   . Hypertension Mother   . Diabetes Father   . Diabetes Brother   . Diabetic kidney disease Brother         and retinopathy  . Alcohol abuse Maternal Grandmother        and some uncles. moms side  . Stroke Paternal Grandmother        smoker as well  . Heart attack Paternal Grandmother   . Diabetes Paternal Grandmother   . Colon cancer Paternal Uncle   . Esophageal cancer Neg Hx   . Rectal cancer Neg Hx   . Stomach cancer Neg Hx     Past Surgical History:  Procedure Laterality Date  . AMPUTATION     3rd toe right  . APPENDECTOMY     1974  . Left Leg Amputee Left 2013   Social History   Occupational History  . Not on file  Tobacco Use  . Smoking status: Former Smoker    Packs/day: 1.00    Years: 15.00    Pack years: 15.00    Quit date: 03/21/2010    Years since quitting: 9.4  . Smokeless tobacco: Never Used  Substance and Sexual Activity  . Alcohol use: No  . Drug use: No  . Sexual activity: Not on file

## 2019-09-21 ENCOUNTER — Other Ambulatory Visit: Payer: Self-pay | Admitting: Family Medicine

## 2020-01-18 ENCOUNTER — Other Ambulatory Visit: Payer: Self-pay | Admitting: Family Medicine

## 2021-07-01 ENCOUNTER — Ambulatory Visit (INDEPENDENT_AMBULATORY_CARE_PROVIDER_SITE_OTHER): Payer: 59 | Admitting: Orthopedic Surgery

## 2021-07-01 DIAGNOSIS — Z89512 Acquired absence of left leg below knee: Secondary | ICD-10-CM | POA: Diagnosis not present

## 2021-07-11 ENCOUNTER — Encounter: Payer: Self-pay | Admitting: Orthopedic Surgery

## 2021-07-11 NOTE — Progress Notes (Addendum)
? ?Office Visit Note ?  ?Patient: Luis Lane           ?Date of Birth: 07-04-1965           ?MRN: [id number] ?Visit Date: 07/01/2021 ?             ?Requested by: No referring provider defined for this encounter. ?PCP: No primary care provider on file. ? ?Chief Complaint  ?Patient presents with  ? Left Leg - Follow-up  ?  HX left BKA prosthetic eval   ? ? ? ? ?HPI: ?Patient is a 56 year old gentleman who is status post left transtibial amputation.  Patient states he is currently living in [place] comes up several times a month.  Patient states that his socket is 54 years old and does not have a good fit.  He is having pain from pressure on the residual limb.  Patient states he is worn 30-60 ply socks. ? ?Assessment & Plan: ?Visit Diagnoses:  ?1. Left below-knee amputee ([place])   ? ? ?Plan: Patient is given a prescription for [employer] for new socket liner materials and supplies.  Patient is currently using a pin liner. ? ?Follow-Up Instructions: No follow-ups on file.  ? ?Ortho Exam ? ?Patient is alert, oriented, no adenopathy, well-dressed, normal affect, normal respiratory effort. ?Examination there are no open ulcers there is no cellulitis patient has had significant decreased volume in the residual limb.  Patient's leg is subsiding into the socket. ? ?Patient is an existing left transtibial  amputee. ? ?Patient's current comorbidities are not expected to impact the ability to function with the prescribed prosthesis. ?Patient verbally communicates a strong desire to use a prosthesis. ?Patient currently requires mobility aids to ambulate without a prosthesis.  Expects not to use mobility aids with a new prosthesis. ? ?Patient is a K3 level ambulator that spends a lot of time walking around on uneven terrain over obstacles, up and down stairs, and ambulates with a variable cadence. ? ?Patient is a very active K3 level transtibial amputee.  Patient is currently using a low-profile Proflex K3 foot.  Patient's job  requires him to walk up and down hills he is constantly on uneven ground.  Patient would benefit from a echelon style prosthetic foot.  The hydraulic ankle would facilitate his activities by absorbing the impact on sloped surfaces and rough terrain.  This would decrease energy requirements for his activities of daily living.  Patient would be able to have a more controlled descent with the hydraulic ankle. ? ? ? ? ?Imaging: ?No results found. ?No images are attached to the encounter. ? ?Labs: ?Lab Results  ?Component Value Date  ? HGBA1C 9.3 (H) 01/18/2019  ? HGBA1C 10.6 (H) 06/01/2018  ? HGBA1C 7.7 07/10/2017  ? ESRSEDRATE 19 (H) 11/05/2006  ? REPTSTATUS 08/01/2009 FINAL 07/29/2009  ? GRAMSTAIN  07/29/2009  ?  MODERATE WBC PRESENT, PREDOMINANTLY PMN ?RARE SQUAMOUS EPITHELIAL CELLS PRESENT ?MODERATE GRAM POSITIVE COCCI ?IN PAIRS MODERATE GRAM NEGATIVE RODS  ? CULT  07/29/2009  ?  MODERATE GROUP B STREP(S.AGALACTIAE)ISOLATED ?Note: TESTING AGAINST S. AGALACTIAE NOT ROUTINELY PERFORMED DUE TO PREDICTABILITY OF AMP/PEN/VAN SUSCEPTIBILITY.  ? ? ? ?Lab Results  ?Component Value Date  ? ALBUMIN 4.8 06/01/2018  ? ALBUMIN 4.5 03/10/2017  ? ALBUMIN 4.8 04/21/2016  ? ? ?No results found for: MG ?No results found for: VD25OH ? ?No results found for: PREALBUMIN ? ?  Latest Ref Rng & Units 06/01/2018  ? 10:32 AM 04/21/2016  ? 10:41 AM 07/29/2009  ?  7:42 AM  ?CBC EXTENDED  ?WBC 4.0 - 10.5 K/uL 5.8   6.9   12.0    ?RBC 4.22 - 5.81 Mil/uL 4.97   5.11   4.10    ?Hemoglobin 13.0 - 17.0 g/dL 15.3   15.3   12.4    ?HCT 39.0 - 52.0 % 43.9   45.5   35.6    ?Platelets 150.0 - 400.0 K/uL 168.0   165.0   206    ? ? ? ?There is no height or weight on file to calculate BMI. ? ?Orders:  ?No orders of the defined types were placed in this encounter. ? ?No orders of the defined types were placed in this encounter. ? ? ? Procedures: ?No procedures performed ? ?Clinical Data: ?No additional findings. ? ?ROS: ? ?All other systems negative, except as  noted in the HPI. ?Review of Systems ? ?Objective: ?Vital Signs: There were no vitals taken for this visit. ? ?Specialty Comments:  ?No specialty comments available. ? ?PMFS History: ?Patient Active Problem List  ? Diagnosis Date Noted  ? History of adenomatous polyp of colon 11/05/2018  ? Hyperlipidemia 04/21/2016  ? Type 2 diabetes mellitus with neurological complications ([place]) 73/53/2992  ? S/P BKA (below knee amputation) unilateral, left ([place]) 04/21/2016  ? Hypertension 04/21/2016  ? Former smoker 04/21/2016  ? Erectile dysfunction 04/21/2016  ? Diabetic neuropathy ([place]) 04/21/2016  ? ?Past Medical History:  ?Diagnosis Date  ? Chicken pox   ? Diabetes mellitus type II, controlled ([place]) 04/21/2016  ? Left BKA 2013. Last a1c in November was 6.2. endocrine [facility] 07/2014. victoza 1.8, metformin 1g BID, lantus 70 BID--> tresiba due to insurance. Humalog 20 units 3x a day. Exercise helps. Nutritionist desired.   ? Hx of BKA, left ([place]) 04/21/2016  ? 2013 Lesette Frary  ? Hyperlipidemia 04/21/2016  ? crestor '10mg'$ , fish oil. Allergic to aspirin even '81mg'$ - hives.   ? Hypertension 04/21/2016  ? Ramipril '10mg'$  BID  ?  ?Family History  ?Problem Relation Age of Onset  ? Diabetes Mother   ?     pt at [facility]  ? Ovarian cancer Mother   ? Hypertension Mother   ? Diabetes Father   ? Diabetes Brother   ? Diabetic kidney disease Brother   ?      and retinopathy  ? Alcohol abuse Maternal Grandmother   ?     and some uncles. moms side  ? Stroke Paternal Grandmother   ?     smoker as well  ? Heart attack Paternal Grandmother   ? Diabetes Paternal Grandmother   ? Colon cancer Paternal Uncle   ? Esophageal cancer Neg Hx   ? Rectal cancer Neg Hx   ? Stomach cancer Neg Hx   ?  ?Past Surgical History:  ?Procedure Laterality Date  ? AMPUTATION    ? 3rd toe right  ? APPENDECTOMY    ? 1974  ? Left Leg Amputee Left 2013  ? ?Social History  ? ?Occupational History  ? Not on file  ?Tobacco Use  ? Smoking status: Former  ?  Packs/day: 1.00  ?  Years: 15.00  ?   Pack years: 15.00  ?  Types: Cigarettes  ?  Quit date: 03/21/2010  ?  Years since quitting: 11.3  ? Smokeless tobacco: Never  ?Substance and Sexual Activity  ? Alcohol use: No  ? Drug use: No  ? Sexual activity: Not on file  ? ? ? ? ? ?

## 2021-07-30 ENCOUNTER — Telehealth: Payer: Self-pay | Admitting: Orthopedic Surgery

## 2021-07-30 NOTE — Telephone Encounter (Signed)
Addended office visit ?
